# Patient Record
Sex: Male | Born: 1970 | Race: White | Hispanic: No | Smoking: Never smoker
Health system: Southern US, Community
[De-identification: ages and names within clinical notes are randomized; demographics above are authoritative.]

## PROBLEM LIST (undated history)

## (undated) DIAGNOSIS — Z981 Arthrodesis status: Secondary | ICD-10-CM

## (undated) DIAGNOSIS — G8929 Other chronic pain: Secondary | ICD-10-CM

## (undated) DIAGNOSIS — K529 Noninfective gastroenteritis and colitis, unspecified: Secondary | ICD-10-CM

## (undated) DIAGNOSIS — N289 Disorder of kidney and ureter, unspecified: Secondary | ICD-10-CM

## (undated) DIAGNOSIS — N200 Calculus of kidney: Secondary | ICD-10-CM

## (undated) HISTORY — PX: MANDIBLE SURGERY: SHX707

## (undated) HISTORY — PX: SHOULDER ARTHROSCOPY WITH ROTATOR CUFF REPAIR: SHX5685

## (undated) HISTORY — PX: CERVICAL FUSION: SHX112

## (undated) HISTORY — PX: ANKLE ARTHROSCOPY: SUR85

---

## 1999-11-12 ENCOUNTER — Encounter: Payer: Self-pay | Admitting: *Deleted

## 1999-11-12 ENCOUNTER — Emergency Department (HOSPITAL_COMMUNITY): Admission: EM | Admit: 1999-11-12 | Discharge: 1999-11-12 | Payer: Self-pay

## 2000-03-01 ENCOUNTER — Emergency Department (HOSPITAL_COMMUNITY): Admission: EM | Admit: 2000-03-01 | Discharge: 2000-03-01 | Payer: Self-pay | Admitting: Emergency Medicine

## 2003-01-15 ENCOUNTER — Emergency Department (HOSPITAL_COMMUNITY): Admission: EM | Admit: 2003-01-15 | Discharge: 2003-01-16 | Payer: Self-pay | Admitting: Emergency Medicine

## 2003-01-16 ENCOUNTER — Encounter: Payer: Self-pay | Admitting: Emergency Medicine

## 2003-03-13 ENCOUNTER — Emergency Department (HOSPITAL_COMMUNITY): Admission: EM | Admit: 2003-03-13 | Discharge: 2003-03-13 | Payer: Self-pay | Admitting: Emergency Medicine

## 2003-03-15 ENCOUNTER — Emergency Department (HOSPITAL_COMMUNITY): Admission: EM | Admit: 2003-03-15 | Discharge: 2003-03-15 | Payer: Self-pay | Admitting: Emergency Medicine

## 2003-04-08 ENCOUNTER — Emergency Department (HOSPITAL_COMMUNITY): Admission: EM | Admit: 2003-04-08 | Discharge: 2003-04-08 | Payer: Self-pay | Admitting: Emergency Medicine

## 2003-04-24 ENCOUNTER — Emergency Department (HOSPITAL_COMMUNITY): Admission: EM | Admit: 2003-04-24 | Discharge: 2003-04-24 | Payer: Self-pay | Admitting: *Deleted

## 2003-04-24 ENCOUNTER — Encounter: Payer: Self-pay | Admitting: *Deleted

## 2003-05-27 ENCOUNTER — Emergency Department (HOSPITAL_COMMUNITY): Admission: EM | Admit: 2003-05-27 | Discharge: 2003-05-27 | Payer: Self-pay | Admitting: Emergency Medicine

## 2003-05-27 ENCOUNTER — Encounter: Payer: Self-pay | Admitting: Emergency Medicine

## 2003-07-12 ENCOUNTER — Emergency Department (HOSPITAL_COMMUNITY): Admission: EM | Admit: 2003-07-12 | Discharge: 2003-07-12 | Payer: Self-pay

## 2003-09-23 ENCOUNTER — Emergency Department (HOSPITAL_COMMUNITY): Admission: EM | Admit: 2003-09-23 | Discharge: 2003-09-23 | Payer: Self-pay | Admitting: Emergency Medicine

## 2003-09-25 ENCOUNTER — Emergency Department (HOSPITAL_COMMUNITY): Admission: EM | Admit: 2003-09-25 | Discharge: 2003-09-25 | Payer: Self-pay | Admitting: Emergency Medicine

## 2003-09-27 ENCOUNTER — Ambulatory Visit (HOSPITAL_COMMUNITY): Admission: RE | Admit: 2003-09-27 | Discharge: 2003-09-27 | Payer: Self-pay | Admitting: Urology

## 2003-09-30 ENCOUNTER — Inpatient Hospital Stay (HOSPITAL_COMMUNITY): Admission: EM | Admit: 2003-09-30 | Discharge: 2003-10-03 | Payer: Self-pay | Admitting: Emergency Medicine

## 2003-10-14 ENCOUNTER — Emergency Department (HOSPITAL_COMMUNITY): Admission: EM | Admit: 2003-10-14 | Discharge: 2003-10-14 | Payer: Self-pay

## 2003-11-12 ENCOUNTER — Emergency Department (HOSPITAL_COMMUNITY): Admission: EM | Admit: 2003-11-12 | Discharge: 2003-11-12 | Payer: Self-pay | Admitting: Emergency Medicine

## 2003-12-04 ENCOUNTER — Emergency Department (HOSPITAL_COMMUNITY): Admission: EM | Admit: 2003-12-04 | Discharge: 2003-12-04 | Payer: Self-pay | Admitting: Emergency Medicine

## 2004-02-02 ENCOUNTER — Emergency Department (HOSPITAL_COMMUNITY): Admission: EM | Admit: 2004-02-02 | Discharge: 2004-02-02 | Payer: Self-pay | Admitting: Emergency Medicine

## 2004-04-16 ENCOUNTER — Emergency Department (HOSPITAL_COMMUNITY): Admission: EM | Admit: 2004-04-16 | Discharge: 2004-04-16 | Payer: Self-pay | Admitting: Emergency Medicine

## 2004-08-02 ENCOUNTER — Emergency Department (HOSPITAL_COMMUNITY): Admission: AC | Admit: 2004-08-02 | Discharge: 2004-08-02 | Payer: Self-pay

## 2004-08-05 ENCOUNTER — Emergency Department (HOSPITAL_COMMUNITY): Admission: EM | Admit: 2004-08-05 | Discharge: 2004-08-05 | Payer: Self-pay | Admitting: Physician Assistant

## 2004-08-05 ENCOUNTER — Emergency Department (HOSPITAL_COMMUNITY): Admission: EM | Admit: 2004-08-05 | Discharge: 2004-08-05 | Payer: Self-pay | Admitting: Emergency Medicine

## 2004-08-14 ENCOUNTER — Emergency Department (HOSPITAL_COMMUNITY): Admission: EM | Admit: 2004-08-14 | Discharge: 2004-08-14 | Payer: Self-pay | Admitting: Family Medicine

## 2004-08-25 IMAGING — RF DG RETROGRADE PYELOGRAM
1 series · 5 of 5 positions shown · non-contrast
Comparison: none

CLINICAL DATA: Lt ureteral calculi.
 ONE VIEW ABDOMEN
 The proximal left ureteral calculus noted by CT scan is not definitely visualized by plain film.  The bowel gas pattern is normal.  There is a phlebolith within the right pelvis. 
 IMPRESSION
 The proximal left ureteral calculus noted on CT scan is not definitely visualized by plain film.
 LEFT RETROGRADE PYELOGRAM
 Images were obtained intraoperatively with a C-arm.  No definite filling defect or stricture is seen on the images available for evaluation.  A left ureteral stent was placed at the time of the procedure with the proximal portion of the stent located within the left renal pelvis.  The distal portion of the stent is not visualized on these views.
 As above.

[Series 745: run · 5 of 5 slices shown]
[im 1/5]
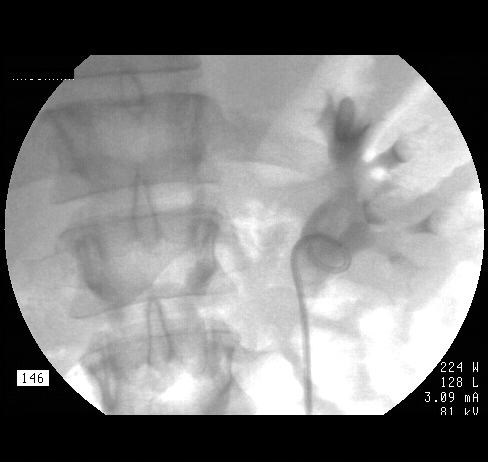
[im 2/5]
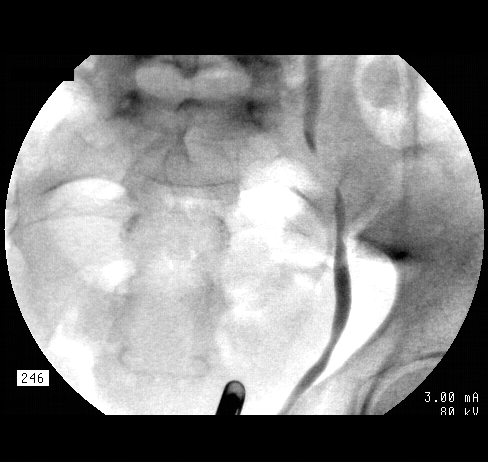
[im 3/5]
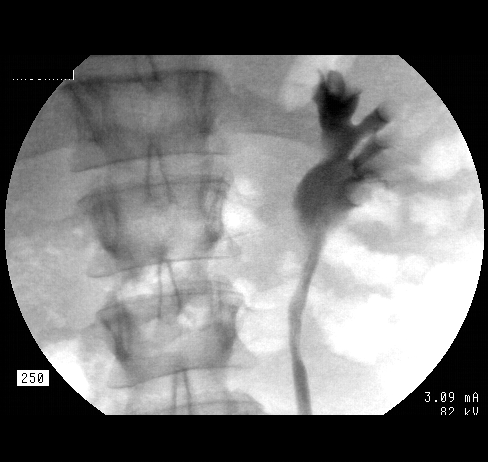
[im 4/5]
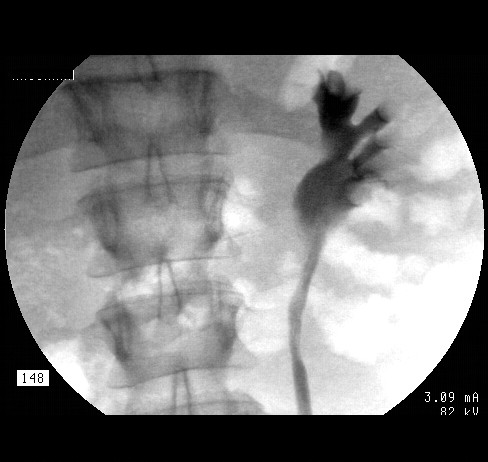
[im 5/5]
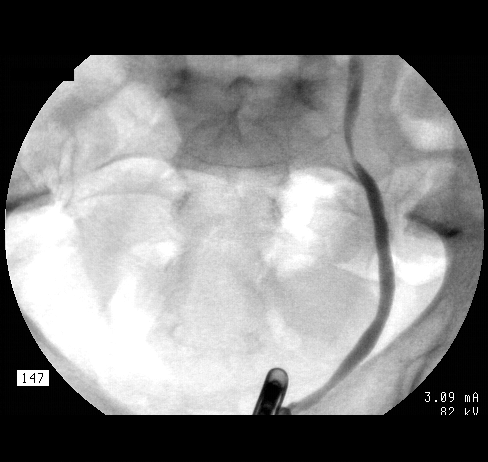

[5 of 5 positions shown; findings below may reference images not displayed]

## 2004-10-06 ENCOUNTER — Emergency Department (HOSPITAL_COMMUNITY): Admission: EM | Admit: 2004-10-06 | Discharge: 2004-10-07 | Payer: Self-pay | Admitting: Emergency Medicine

## 2004-10-17 ENCOUNTER — Emergency Department (HOSPITAL_COMMUNITY): Admission: EM | Admit: 2004-10-17 | Discharge: 2004-10-17 | Payer: Self-pay

## 2004-10-30 ENCOUNTER — Emergency Department (HOSPITAL_COMMUNITY): Admission: EM | Admit: 2004-10-30 | Discharge: 2004-10-31 | Payer: Self-pay | Admitting: *Deleted

## 2004-11-11 ENCOUNTER — Ambulatory Visit (HOSPITAL_COMMUNITY): Admission: RE | Admit: 2004-11-11 | Discharge: 2004-11-11 | Payer: Self-pay | Admitting: Urology

## 2004-11-14 ENCOUNTER — Emergency Department (HOSPITAL_COMMUNITY): Admission: EM | Admit: 2004-11-14 | Discharge: 2004-11-14 | Payer: Self-pay | Admitting: Family Medicine

## 2004-12-02 ENCOUNTER — Emergency Department: Payer: Self-pay | Admitting: General Practice

## 2004-12-09 ENCOUNTER — Emergency Department (HOSPITAL_COMMUNITY): Admission: EM | Admit: 2004-12-09 | Discharge: 2004-12-09 | Payer: Self-pay | Admitting: Family Medicine

## 2005-01-15 ENCOUNTER — Inpatient Hospital Stay (HOSPITAL_COMMUNITY): Admission: EM | Admit: 2005-01-15 | Discharge: 2005-01-19 | Payer: Self-pay | Admitting: Emergency Medicine

## 2005-04-18 ENCOUNTER — Encounter: Admission: RE | Admit: 2005-04-18 | Discharge: 2005-04-18 | Payer: Self-pay | Admitting: Occupational Medicine

## 2005-09-25 IMAGING — CT CT ABDOMEN W/O CM
1 series · 15 of 32 positions shown, 19 images · non-contrast
Comparison: 10/17/04.

CLINICAL DATA: Abdominal and pelvic pain, right-sided flank pain.
TECHNIQUE: Multidetector helical CT scanning obtained through the abdomen and pelvis.

[Series 8567: — · axial · 0.65mm/px · z∈[+1298,+1658]mm · 15 of 81 slices shown, 19 images]
[im 6/81  soft-tissue]
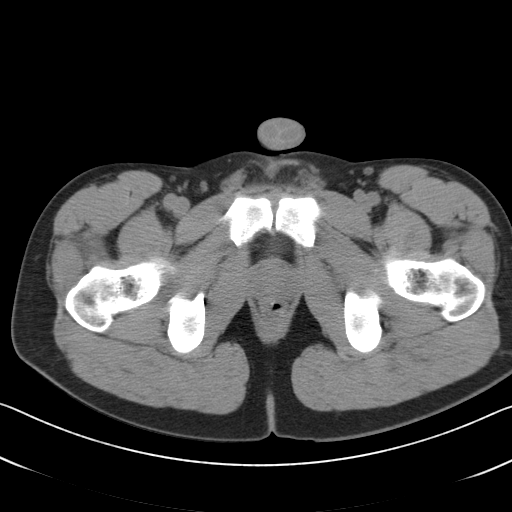
[im 6/81  bone]
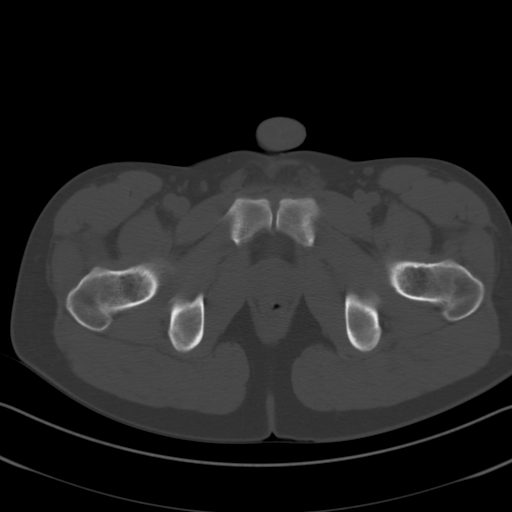
[im 11/81  soft-tissue]
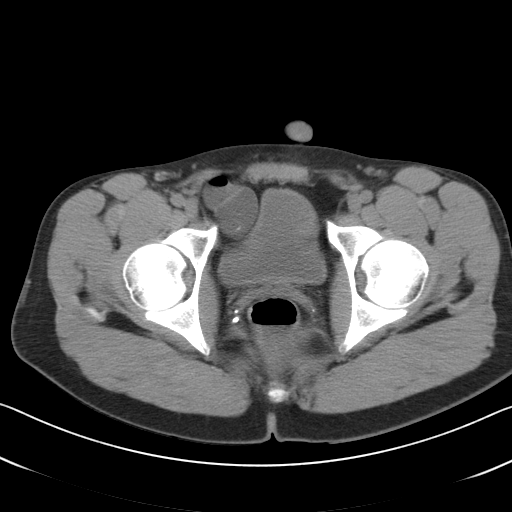
[im 16/81  soft-tissue]
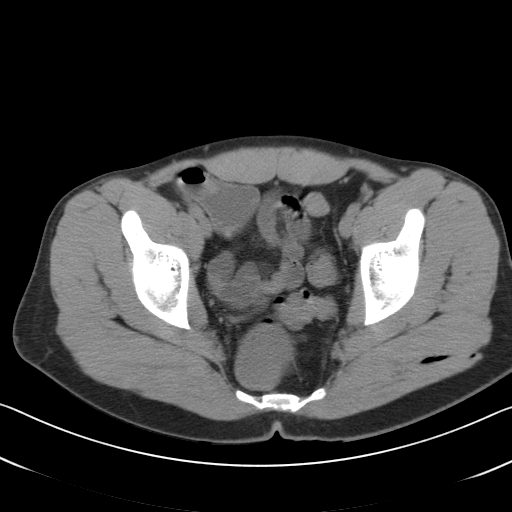
[im 24/81  soft-tissue]
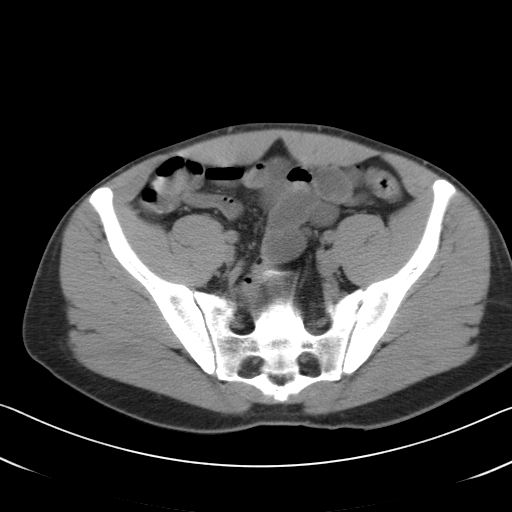
[im 29/81  soft-tissue]
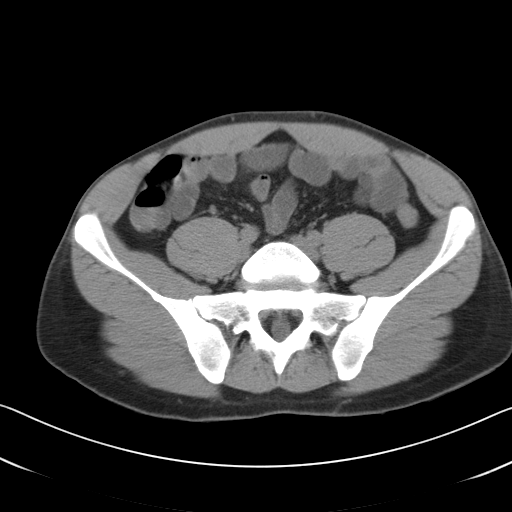
[im 34/81  soft-tissue]
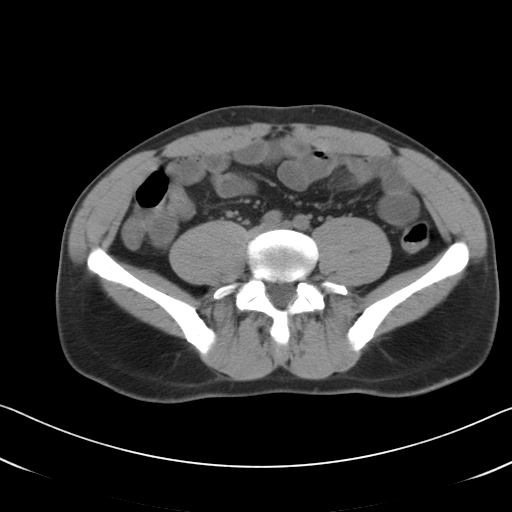
[im 42/81  soft-tissue]
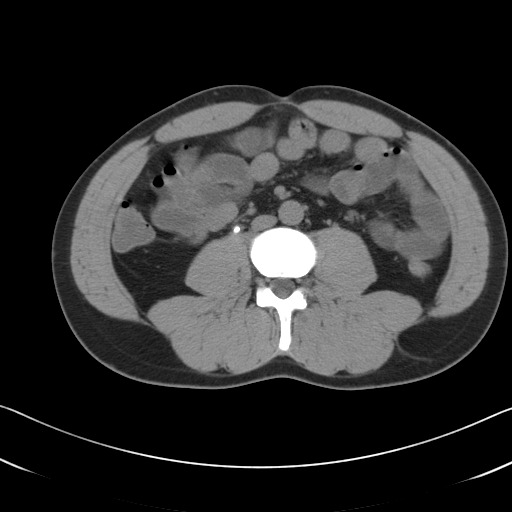
[im 47/81  soft-tissue]
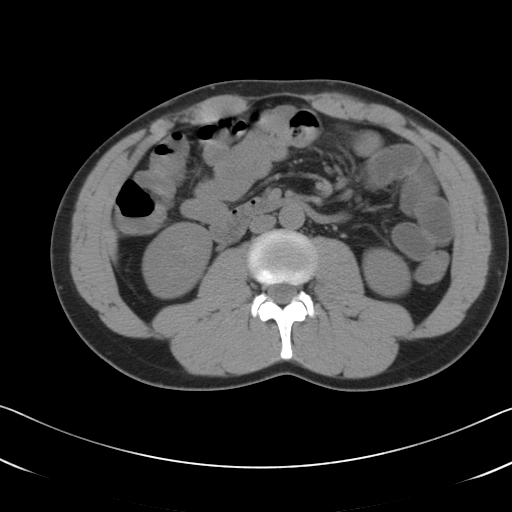
[im 52/81  soft-tissue]
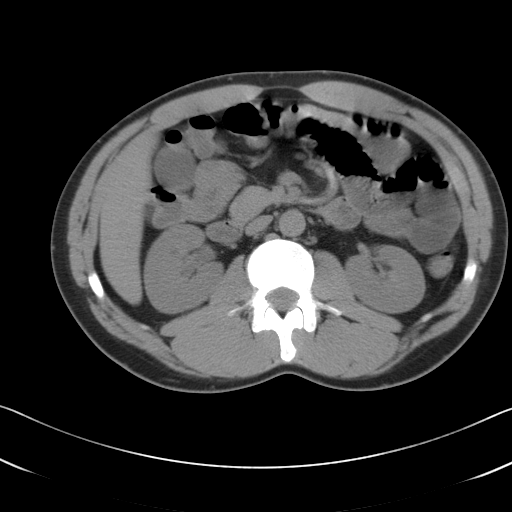
[im 52/81  bone]
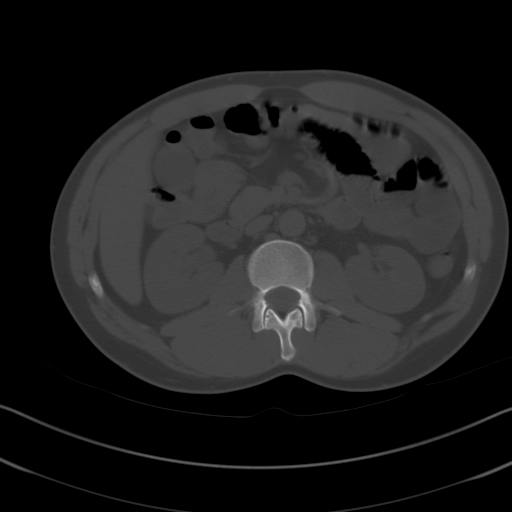
[im 57/81  soft-tissue]
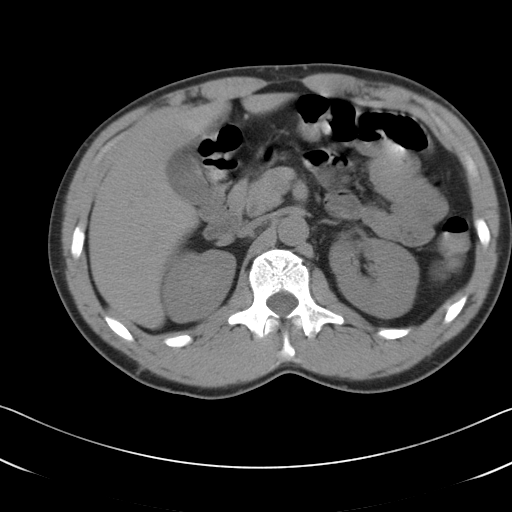
[im 65/81  soft-tissue]
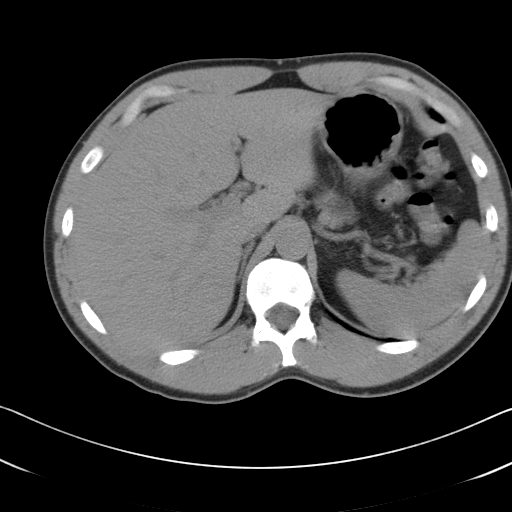
[im 70/81  soft-tissue]
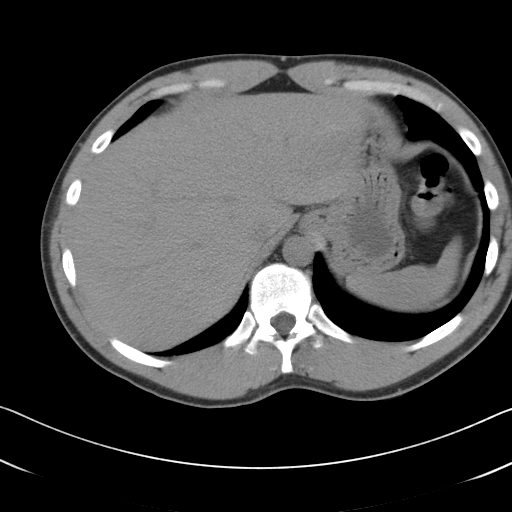
[im 70/81  lung]
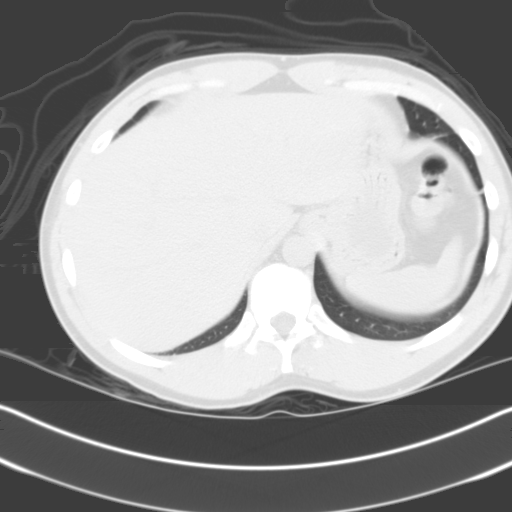
[im 73/81  lung]
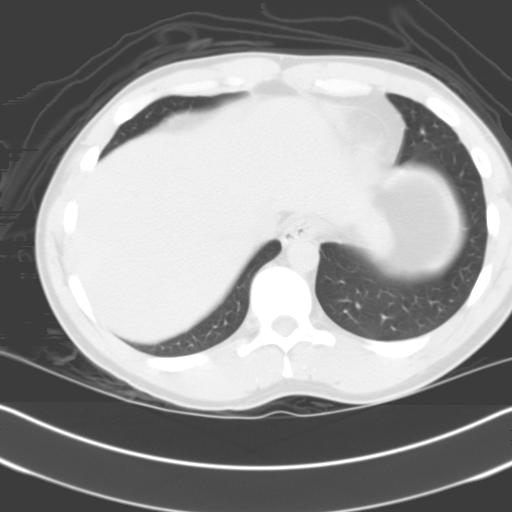
[im 75/81  soft-tissue]
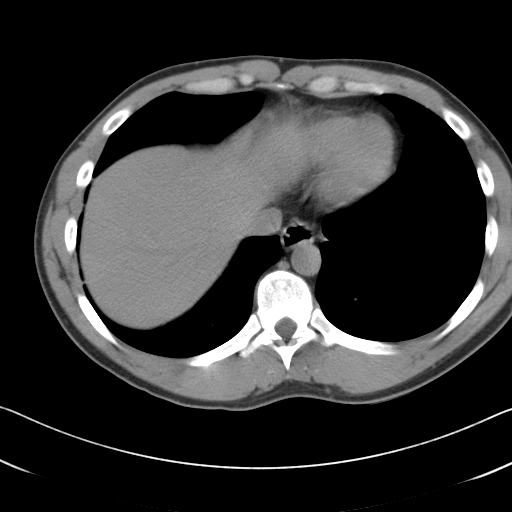
[im 75/81  lung]
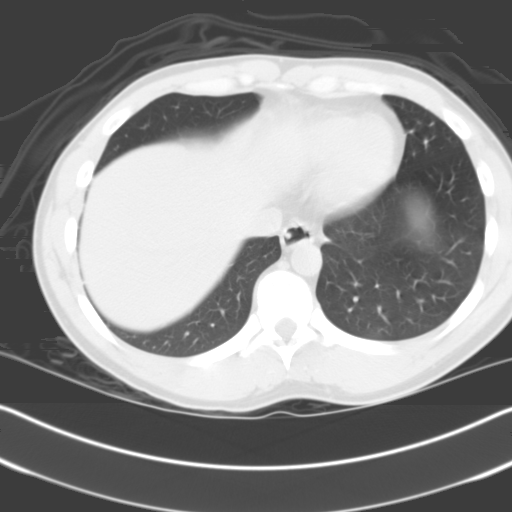
[im 78/81  lung]
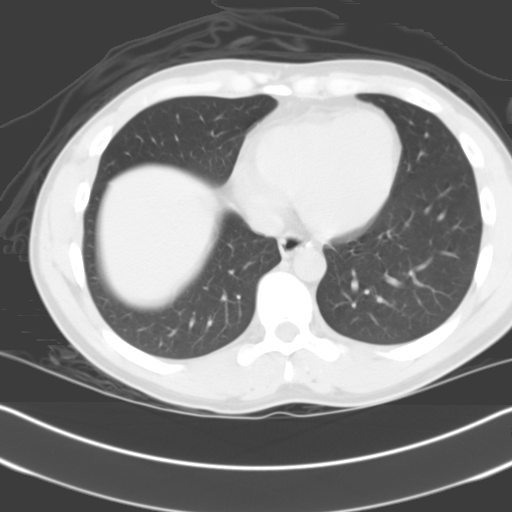

[15 of 32 positions shown; findings below may reference images not displayed]

CT ABDOMEN WITHOUT CONTRAST:
 Mild right hydronephrosis has decreased from the last study.  5 mm calculus in the proximal right ureter is stable.  10 x 6 mm non-obstructing right upper pole renal calculus is unchanged as well as a few tiny punctate bilateral renal calculi.  The liver, gallbladder, spleen, adrenal glands, pancreas are unremarkable.  Please note that parenchymal lesions may be missed as intravenous contrast was not administered.  Fluid-filled loops of small bowel and colon noted without wall thickening or obstruction.  No evidence of abdominal aortic aneurysm, free fluid, or enlarged lymph nodes.
IMPRESSION: 1.  Stable 5 mm proximal right ureteral calculus with decreasing right hydronephrosis.
 2.  Stable non-obstructing renal calculi.
 3.  Fluid-filled loops of small bowel and colon.  This can be seen with gastroenteritis but correlate clinically.
 CT PELVIS WITHOUT CONTRAST:
 Fluid-filled loops of nondistended small bowel and colon noted.  The bladder is unremarkable.  No evidence of free fluid or enlarged lymph nodes.  Gas in the left gluteus maximus muscle is new.  If this patient has not had a recent injection, infection is not excluded.  Bilateral pars defects L-5.  Grade I anterolisthesis of L-5 on S-1 is unchanged.
IMPRESSION: 1.  Gas in the left gluteus maximus muscle.  If the patient has not had injection or a procedure in this area then infection is not excluded.  Recommend followup as indicated. 
 2.  Fluid-filled nondistended small bowel and colon. This can be seen with gastroenteritis. 
 3.  Stable L-5 pars defects and grade I anterolisthesis of L-5 on S-1.

## 2005-10-15 ENCOUNTER — Encounter: Admission: RE | Admit: 2005-10-15 | Discharge: 2005-10-15 | Payer: Self-pay | Admitting: Sports Medicine

## 2005-10-24 ENCOUNTER — Encounter: Admission: RE | Admit: 2005-10-24 | Discharge: 2005-10-24 | Payer: Self-pay | Admitting: Sports Medicine

## 2005-10-28 ENCOUNTER — Ambulatory Visit (HOSPITAL_BASED_OUTPATIENT_CLINIC_OR_DEPARTMENT_OTHER): Admission: RE | Admit: 2005-10-28 | Discharge: 2005-10-28 | Payer: Self-pay | Admitting: Orthopedic Surgery

## 2005-11-23 ENCOUNTER — Emergency Department (HOSPITAL_COMMUNITY): Admission: EM | Admit: 2005-11-23 | Discharge: 2005-11-23 | Payer: Self-pay | Admitting: Family Medicine

## 2006-01-06 ENCOUNTER — Emergency Department (HOSPITAL_COMMUNITY): Admission: EM | Admit: 2006-01-06 | Discharge: 2006-01-06 | Payer: Self-pay | Admitting: Emergency Medicine

## 2006-04-10 ENCOUNTER — Emergency Department: Payer: Self-pay | Admitting: Emergency Medicine

## 2007-04-20 ENCOUNTER — Emergency Department (HOSPITAL_COMMUNITY): Admission: EM | Admit: 2007-04-20 | Discharge: 2007-04-20 | Payer: Self-pay | Admitting: *Deleted

## 2007-06-24 ENCOUNTER — Emergency Department (HOSPITAL_COMMUNITY): Admission: EM | Admit: 2007-06-24 | Discharge: 2007-06-24 | Payer: Self-pay | Admitting: Emergency Medicine

## 2007-09-10 ENCOUNTER — Emergency Department (HOSPITAL_COMMUNITY): Admission: EM | Admit: 2007-09-10 | Discharge: 2007-09-10 | Payer: Self-pay | Admitting: *Deleted

## 2007-10-26 ENCOUNTER — Emergency Department (HOSPITAL_COMMUNITY): Admission: EM | Admit: 2007-10-26 | Discharge: 2007-10-26 | Payer: Self-pay | Admitting: Family Medicine

## 2008-08-13 ENCOUNTER — Emergency Department (HOSPITAL_COMMUNITY): Admission: EM | Admit: 2008-08-13 | Discharge: 2008-08-13 | Payer: Self-pay | Admitting: Family Medicine

## 2008-10-10 ENCOUNTER — Ambulatory Visit (HOSPITAL_COMMUNITY): Admission: AD | Admit: 2008-10-10 | Discharge: 2008-10-10 | Payer: Self-pay | Admitting: Family Medicine

## 2008-11-23 ENCOUNTER — Ambulatory Visit (HOSPITAL_COMMUNITY): Admission: RE | Admit: 2008-11-23 | Discharge: 2008-11-24 | Payer: Self-pay | Admitting: Neurosurgery

## 2009-10-17 ENCOUNTER — Emergency Department (HOSPITAL_COMMUNITY): Admission: EM | Admit: 2009-10-17 | Discharge: 2009-10-17 | Payer: Self-pay | Admitting: Family Medicine

## 2009-10-21 ENCOUNTER — Emergency Department (HOSPITAL_COMMUNITY): Admission: EM | Admit: 2009-10-21 | Discharge: 2009-10-21 | Payer: Self-pay | Admitting: Emergency Medicine

## 2010-03-17 ENCOUNTER — Emergency Department (HOSPITAL_COMMUNITY): Admission: EM | Admit: 2010-03-17 | Discharge: 2010-03-17 | Payer: Self-pay | Admitting: Family Medicine

## 2010-07-04 ENCOUNTER — Ambulatory Visit (HOSPITAL_COMMUNITY): Admission: RE | Admit: 2010-07-04 | Discharge: 2010-07-04 | Payer: Self-pay | Admitting: Family Medicine

## 2010-07-19 ENCOUNTER — Emergency Department (HOSPITAL_COMMUNITY): Admission: EM | Admit: 2010-07-19 | Discharge: 2010-07-19 | Payer: Self-pay | Admitting: Emergency Medicine

## 2010-10-20 ENCOUNTER — Encounter: Payer: Self-pay | Admitting: Orthopedic Surgery

## 2010-10-20 ENCOUNTER — Encounter: Payer: Self-pay | Admitting: *Deleted

## 2010-12-11 LAB — URINE MICROSCOPIC-ADD ON

## 2010-12-11 LAB — DIFFERENTIAL
Basophils Absolute: 0 10*3/uL (ref 0.0–0.1)
Lymphocytes Relative: 38 % (ref 12–46)
Neutro Abs: 1.4 10*3/uL — ABNORMAL LOW (ref 1.7–7.7)

## 2010-12-11 LAB — CBC
HCT: 34.1 % — ABNORMAL LOW (ref 39.0–52.0)
MCH: 26.4 pg (ref 26.0–34.0)
MCHC: 32 g/dL (ref 30.0–36.0)
MCV: 82.6 fL (ref 78.0–100.0)
RBC: 4.13 MIL/uL — ABNORMAL LOW (ref 4.22–5.81)
RDW: 16.7 % — ABNORMAL HIGH (ref 11.5–15.5)

## 2010-12-11 LAB — URINALYSIS, ROUTINE W REFLEX MICROSCOPIC
Bilirubin Urine: NEGATIVE
Ketones, ur: NEGATIVE mg/dL
Leukocytes, UA: NEGATIVE
Nitrite: NEGATIVE
Protein, ur: NEGATIVE mg/dL
Urobilinogen, UA: 0.2 mg/dL (ref 0.0–1.0)

## 2010-12-11 LAB — BASIC METABOLIC PANEL
BUN: 10 mg/dL (ref 6–23)
Creatinine, Ser: 0.97 mg/dL (ref 0.4–1.5)
GFR calc Af Amer: >60 mL/min (ref >60)
GFR calc non Af Amer: >60 mL/min (ref >60)

## 2010-12-11 LAB — HEMOCCULT GUIAC POC 1CARD (OFFICE): Fecal Occult Bld: NEGATIVE

## 2010-12-16 LAB — POCT URINALYSIS DIP (DEVICE)
Bilirubin Urine: NEGATIVE
Glucose, UA: NEGATIVE mg/dL
Nitrite: NEGATIVE

## 2010-12-16 LAB — POCT I-STAT, CHEM 8
Chloride: 110 mEq/L (ref 96–112)
HCT: 38 % — ABNORMAL LOW (ref 39.0–52.0)
Potassium: 4.1 mEq/L (ref 3.5–5.1)
Sodium: 139 mEq/L (ref 135–145)

## 2011-01-14 LAB — BASIC METABOLIC PANEL
Chloride: 105 mEq/L (ref 96–112)
GFR calc Af Amer: >60 mL/min (ref >60)
Potassium: 4.5 mEq/L (ref 3.5–5.1)
Sodium: 139 mEq/L (ref 135–145)

## 2011-01-14 LAB — URINALYSIS, ROUTINE W REFLEX MICROSCOPIC
Glucose, UA: NEGATIVE mg/dL
Nitrite: NEGATIVE
Protein, ur: NEGATIVE mg/dL
pH: 5 (ref 5.0–8.0)

## 2011-01-14 LAB — CBC
HCT: 37.1 % — ABNORMAL LOW (ref 39.0–52.0)
MCV: 87.8 fL (ref 78.0–100.0)
RBC: 4.22 MIL/uL (ref 4.22–5.81)
WBC: 4.5 10*3/uL (ref 4.0–10.5)

## 2011-01-14 LAB — APTT: aPTT: 29 seconds (ref 24–37)

## 2011-02-06 ENCOUNTER — Emergency Department (HOSPITAL_COMMUNITY)
Admission: EM | Admit: 2011-02-06 | Discharge: 2011-02-07 | Disposition: A | Discharge status: Home / Self Care | Payer: Self-pay | Attending: Emergency Medicine | Admitting: Emergency Medicine

## 2011-02-06 DIAGNOSIS — F329 Major depressive disorder, single episode, unspecified: Secondary | ICD-10-CM | POA: Insufficient documentation

## 2011-02-06 DIAGNOSIS — F3289 Other specified depressive episodes: Secondary | ICD-10-CM | POA: Insufficient documentation

## 2011-02-06 DIAGNOSIS — R319 Hematuria, unspecified: Secondary | ICD-10-CM | POA: Insufficient documentation

## 2011-02-06 DIAGNOSIS — R109 Unspecified abdominal pain: Secondary | ICD-10-CM | POA: Insufficient documentation

## 2011-02-07 LAB — URINALYSIS, ROUTINE W REFLEX MICROSCOPIC
Ketones, ur: NEGATIVE mg/dL
Leukocytes, UA: NEGATIVE
Nitrite: NEGATIVE
Specific Gravity, Urine: 1.02 (ref 1.005–1.030)
Urobilinogen, UA: 0.2 mg/dL (ref 0.0–1.0)
pH: 6 (ref 5.0–8.0)

## 2011-02-07 LAB — URINE MICROSCOPIC-ADD ON

## 2011-02-11 NOTE — Op Note (Signed)
NAME:  Dale Contreras, Dale Contreras NO.:  [id number]   MEDICAL RECORD NO.:  [id number]          PATIENT TYPE:  OIB   LOCATION:  3534                         FACILITY:  [facility]   PHYSICIAN:  Clydene Fake, M.D.  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   POSTOPERATIVE DIAGNOSIS:  Herniated nucleus pulposus spondylosis C5-6  and C6-7 and with left-sided radiculopathy.   PROCEDURES:  Anterior cervical decompression, diskectomy, and fusion of  C5-6 and C6-7 with LifeNet allograft bone and Trestle anterior cervical  plate.   SURGEON:  Clydene Fake, MD   ASSISTANT:  Coletta Memos, M.D.   ANESTHESIA:  General endotracheal tube anesthesia.   ESTIMATED BLOOD LOSS:  200 mL.   BLOOD GIVEN:  None.   DRAINS:  None.   COMPLICATIONS:  None.   REASON FOR PROCEDURE:  The patient is a 40 year old gentleman with neck  and left arm pain, numbness and weakness, and left C7 radiculopathy with  weakness in left triceps and finger extension, decreased sensation in  left C7 distribution.  An MRI was done showing degenerative change,  spondylytic change at C5-6 with bifemoral narrowing and some  degenerative change at C6-7 also with a large left-sided disk  herniation.  The patient brought in for decompression and fusion at  these levels.   PROCEDURE IN DETAIL:  The patient was brought in the operating room and  general anesthesia was induced.  The patient was then prepped and draped  in a sterile fashion.  The site of incision was injected 10 mL of 1%  lidocaine with epinephrine.  Incision then made from the midline to the  medial border of the sternocleidomastoid muscle on the left side.  Neck  incision was taken down to the platysma and hemostasis was obtained with  Bovie  cauterization.  The platysma was incised with Bovie and blunt  dissection taken to the anterior cervical fascia to the anterior  cervical spine.  Two needles were placed.  X-rays were obtained showing  the needles were at the C4-5 and C5-6 interspaces.  The needle was  removed as the C5-6 disk space was incised with a 15 blade and partial  discectomy was performed.  Longus coli muscles were reflected laterally  on each side and self-retaining retractors were placed into the C5-6 and  C6-7 interspaces.  Disk spaces were again incised with a 15 blade and  diskectomy started with pituitary rongeurs and curettes.  Anterior  osteophytes were removed with Kerrison punches.  Distraction pins were  then placed in the C5 and C7 interspaces and distracted.  Microscope was  brought in for microdissection.  We placed retractors between the C5-6  and C6-7 disk spaces.  Microscope was brought in for microdissection and  starting at C6-7, diskectomy continued with the curettes, pituitary  rongeurs, and 1-and 2-mm Kerrison punches to remove posterior ligament  disk osteophytes and performed bilateral foraminotomies.  There were  free fragments of disk in the left side that was causing significant  compression of the nerve root.  When we finished, we had good  decompression of the nerve root.  On the right side, foraminotomy was  done.  We did get some fairly impressive venous bleeding there.  This  was an easily stopped with Gelfoam and small Gelfoam was left in the  lateral gutter there to keep the hemostasis.  High-speed drill was used  to remove cartilaginous endplate and measured the height of disk space  to be 7 mm and 7-mm LifeNet allograft bone was tapped into place at the  C5-6 disk space, the diskectomy was continued there with curettes,  pituitary rongeurs, 1-and 2-mm Kerrison punches removing posterior disk  osteophytes and ligament decompressing the central canal and performing  bilateral  foraminotomies.  High-speed drill was used to remove callous  endplate and measured the disk space to be 5 mm.  A 5-mm LifeNet  allograft bone was tapped into place there.  We irrigated with  antibiotic solution and the distraction pins were removed.  Hemostasis  obtained with Gelfoam and thrombin.  The weight was removed from the  traction at this point and the bone plugs were firmly in place, and the  Trestle anterior cervical plate was placed over the anterior cervical  spine.  Two screws were placed in the C5, C6, and C7.  These were  tighten down.  Lateral x-rays were obtained showing good position of  plate, screws, and bone plugs at C5-6.  We could not see the C6-7 level  up to the shoulders.  Intraoperatively it looked good.  He had good  hemostasis.  Irrigated with antibiotic solution.  Retractors were  removed and the platysma was closed with 3-0 Vicryl interrupted sutures.  Subcutaneous tissue was closed with 3-0 Vicryl interrupted sutures.  Skin closed with benzoin and Steri-Strips.  Dressing was placed.  The  patient was placed in a soft cervical collar, awoken from anesthesia,  and transferred to recovery room in stable condition.           ______________________________  Clydene Fake, M.D.     JRH/MEDQ  D:  11/23/2008  T:  11/24/2008  Job:  [id number]

## 2011-02-14 NOTE — H&P (Signed)
NAME:  Dale Contreras, Dale Contreras                          ACCOUNT NO.:  [id number]   MEDICAL RECORD NO.:  [id number]                   PATIENT TYPE:  INP   LOCATION:  A337                                 FACILITY:  [facility]   PHYSICIAN:  Dennie Maizes, M.D.                DATE OF BIRTH:  Oct 16, 1970   DATE OF ADMISSION:  09/30/2003  DATE OF DISCHARGE:                                HISTORY & PHYSICAL   CHIEF COMPLAINT:  Severe left flank pain radiating to the front, nausea and  vomiting.   HISTORY OF PRESENT ILLNESS:  This 40 year old male has a past history of  recurrent nephrolithiasis.  He has passed several stones during the past  several years. He experienced sudden onset of severe left flank pain  radiating to the front about two days ago. Denied having any __________ or  hematuria. He had been having moderate nausea and vomiting. He had been to  the emergency room x2. Further evaluation has been done with a noncontrast  CT scan of the abdomen and pelvis. This revealed bilateral small renal  calculi. There was moderate left hydronephrosis. There was an obstructing 8  x 6 mm-sized left upper ureteral calculus at the level of the uteropelvic  junction, the sound of the distal ureteral calculus on the left side  measuring 6 x 4 mm in size. The patient did not have adequate pain relief in  the ER. He had been admitted to the hospital for pain control and definitive  treatment.   PAST MEDICAL HISTORY:  History of recurrent nephrolithiasis. No medical  illnesses.   MEDICATIONS:  None.   ALLERGIES:  None.   PHYSICAL EXAMINATION:  HEENT:  Normal.  NECK:  No masses.  LUNGS:  Clear to auscultation.  HEART:  Regular rate and rhythm. No murmurs.  ABDOMEN:  Soft, no palpable flank mass. Moderate left costovertebral angle  tenderness is noted. Bladder not palpable. Penis and testes are normal.   ADMISSION LABORATORY DATA:  CBC:  WBC 10.7, hemoglobin 13.4, hematocrit  40.1. Electrolytes:  Within  normal range. BUN 18, creatinine 1.6. Urinalysis  revealed blood large, leukocyte esterase negative, RBCs 21 to 52 per high  powered field, WBCs 0 to 2 per high powered field, bacteria few.   IMPRESSION:  Left upper ureter calculus with obstruction, left  hydronephrosis, left renal colic, bilateral renal calculi.   PLAN:  1. Admit the patient to the hospital.  2. IV fluids.  3. Parenteral narcotics.  4. Strain all urine for stones.  5. Discuss with the patient about management options. As he has severe     persistent pain, he has agreed for cystoscopy, left retrograde pyelogram,     left ureteroscopy with basket extraction, and left ureteral stent     placement under anesthesia October 01, 2003. I  have discussed with the     patient regarding diagnosis, operative details, alternative treatments,  outcome, and possible risks and complications, and he has agreed for the     procedure to be done.     ___________________________________________                                         Dennie Maizes, M.D.   SK/MEDQ  D:  09/30/2003  T:  09/30/2003  Job:  [id number]

## 2011-02-14 NOTE — H&P (Signed)
NAME:  Dale Contreras, CU NO.:  [id number]   MEDICAL RECORD NO.:  [id number]          PATIENT TYPE:  EMS   LOCATION:  ED                           FACILITY:  [facility]   PHYSICIAN:  Jackie Plum, M.D.DATE OF BIRTH:  07-21-1971   DATE OF ADMISSION:  01/15/2005  DATE OF DISCHARGE:                                HISTORY & PHYSICAL   CHIEF COMPLAINT:  Generalized weakness and bruising of the skin.   HISTORY OF PRESENT ILLNESS:  Mr. Dale Contreras is a 40 year old Caucasian gentleman  who presented with above complaints.  He was accompanied to the ER by his  mother and aunt.  According to the patient, he was well and about 2 days ago  experienced some generalized weakness after a brief spider bite.  He said he  was bit by a spider he believes on the left upper trunk area.  Since then,  he is having some generalized weakness, episodes of nausea.  He had vomited  only one time.  He has had some episodes of fever without any chills as  well.  He has been feeling weak with myalgia.  Yesterday, the patient was  found by the family around 8 o'clock in the morning.  He was noted to be  disoriented and they gave him some p.o. fluids, after which they said he  became more coherent around 1 o'clock in the morning.  They did not seek  medical assistance because around 1:30 p.m., he became more coherent.  He  presents with complaints of some pain in his joints as well as myalgia  mainly involving his right hip and right elbow area.  He denies any  constipation, diarrhea, frequency of micturition, or dysuria.  However, he  alleges that he has been urinating a lot and has had the need to drink a lot  of fluids.  He denies any chest pain, shortness of breath, PND, orthopnea,  palpitations, cough, sputum production, hemoptysis, hematuria, hematochezia,  melena, or abdominal pain.  His current main complaints are generalized  weakness, myalgia, and fever, as well as headaches with elbow and hip  pain  on the right side.   PAST MEDICAL HISTORY:  The patient does not have any previous history of  diabetes, hypertension.  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  He has had lithotripsy for renal stones previously.   MEDICATIONS:  He is not on any regular medicines at the moment.   ALLERGIES:  He has indicated that he is allergic to [facility].   FAMILY HISTORY:  Negative for any history of heart disease or stroke.   SOCIAL HISTORY:  The patient was recently divorced by his wife about 2 weeks  ago.  He lives in his own home.  He denies alcohol or illicit drug use.  However, the aunt who accompanied the patient later told me in confidence  that he has been drinking a lot.   PHYSICAL EXAMINATION:  VITAL SIGNS:  Blood pressure 137/81, temperature  101.0.  This has come down to 100.0 after Tylenol in the ER.  Pulse rate was  initially 114 per minute with a peak of 117 per minute but had come down to  98 per minute at the time of my evaluation, respirations 20, O2 saturation  98% on room air.  GENERAL:  The patient looked weak.  However, he was not pale looking.  He  was not in any acute cardiopulmonary distress.  HEENT:  He had bruising of his right lower parietal area of his skull.  Pupils were equal, round, and reactive to light.  Extraocular movements  intact.  Oropharynx moist.  No erythema.  No conjunctival lesions were found  on his tongue as previously complained to nurse by patient.  NECK:  Supple, no JVD.  He did not have any cervical lymphadenopathy.  No  thyromegaly.  LUNGS:  Clear to auscultation bilaterally.  CARDIAC:  Regular rate and rhythm.  No gallops or murmur.  ABDOMEN:  He had a right upper surgical scar which was well healed.  Bowel  sounds were present, normoactive.  No tenderness.  No rebound tenderness.  There was no guarding.  EXTREMITIES:  No cyanosis, no edema.  SKIN:  He had some bruising about 4 cm x 3 cm in the area of his left upper  back  region.  It was not blanchable.  NEUROLOGIC:  The patient was alert, oriented x3, no acute focal deficits.   LABORATORY DATA:  CT scan of the head was done by the ED physician which was  negative for any acute intracranial process.  WBC 7.4, hemoglobin 13.5,  hematocrit 38.8, MCV 87.0, platelet count 340.  PT 1.5, INR 38 seconds.  Sodium 138, potassium 3.3, chloride 104, CO2 28, glucose 113, BUN 9,  creatinine 1.1, calcium 8.0, total protein 5.8, albumin 3.1, alkaline  phosphatase 154.  Total bilirubin normal at 1.1.  Urinalysis indicated color  was amber, appearance clear, specific gravity 1.018, pH 6.5, glucose 100,  HEMOGLOBIN WAS LARGE IN THE URINE, bilirubin negative, ketones negative,  total protein 100 mg/dl, urobilinogen 4.0, nitrite negative, leukocyte  esterase negative, urine WBC 0-2 per high power field, RBC of urine 0-2 per  high power field as well.   IMPRESSION:  1.  Transaminitis, generalized weakness, myalgia, bruising, with large      hemoglobin on urinalysis and surprisingly normal urine red blood cells      in a young gentleman who recently was divorced from his wife.  The most      likely cause of this patient's symptomatology is rhabdomyolysis in view      of urinalysis findings, elevated isolated transaminitis, history from      the aunt who mentioned that the patient has been drinking a lot which      could cause rhabdomyolysis in this patient.  2.  Hypokalemia, which may be due to diagnosis #1.   PLAN:  Get ultrasound of the abdomen to look at the hepatobiliary system as  well as total CPK. We will order other laboratory workup including urine  drug screen, ESR.  We will also obtain x-ray of the right hip and elbow  because of complaints of pain in these areas.  On account of fever of  inexplicable etiology, will get urine culture and blood culture as well as chest x-ray.  We will also obtain a 12-lead EKG.  We will get ANA in  addition to ESR.  Plan is to  start the patient also on IV fluid.  Will start  him with D5 normal saline with 20 mEq of KCl in  the interim and continue him  on thiamine and folic acid based on history of alcohol abuse.  We will  change the IV fluid to saline should the CPK be really elevated as  suspected.  We will continue supportive care with antiemetics.  He may  benefit from psychiatric consult and evaluation while in the hospital.  Further recommendations will be made as the database expands.      GO/MEDQ  D:  01/15/2005  T:  01/15/2005  Job:  [id number]

## 2011-02-14 NOTE — Op Note (Signed)
NAME:  Dale Contreras, Dale Contreras                ACCOUNT NO.:  [id number]   MEDICAL RECORD NO.:  [id number]          PATIENT TYPE:  AMB   LOCATION:  DSC                          FACILITY:  [facility]   PHYSICIAN:  Robert A. Thurston Hole, M.D. DATE OF BIRTH:  [birth year]   DATE OF PROCEDURE:  10/28/2005  DATE OF DISCHARGE:                                 OPERATIVE REPORT   PREOPERATIVE DIAGNOSES:  1.  Left shoulder SLAP tear.  2.  Left shoulder loose bodies.   POSTOPERATIVE DIAGNOSES:  1.  Left shoulder SLAP tear.  2.  Left shoulder loose bodies.   PROCEDURE:  1.  Left shoulder EUA followed by arthroscopically assisted SLAP repair      using Arthrex suture anchors x2.  2.  Left shoulder removal and debridement of loose bodies.   SURGEON:  Elana Alm. Thurston Hole, M.D.   ASSISTANT:  Julien Girt, P.A.   ANESTHESIA:  General.   OPERATIVE TIME:  1 hour.   COMPLICATIONS:  None.   INDICATIONS FOR PROCEDURE:  Dale Contreras is a 40 year old gentleman who has  had over a year of left shoulder pain increasing in nature with exam and MRI  documenting an SLAP tear and possible loose bodies. He has failed  conservative care and is now to undergo arthroscopy and repair.   DESCRIPTION:  Dale Contreras was brought to operating room on October 28, 2005  after interscalene block was placed in the holding room by anesthesia. He  was placed on the operative table in supine position. His left shoulder was  examined under anesthesia. He had full range of motion and shoulder was  stable to ligamentous exam. He then placed in beach chair position and his  shoulder and arm was prepped using sterile DuraPrep and draped using sterile  technique. He received Ancef 1 g IV preoperatively for prophylaxis.  Initially through a posterior arthroscopic portal, the arthroscope with a  pump attached was placed and through an anterior portal, an arthroscopic  probe was placed. On initial inspection the articular cartilage in the  glenohumeral joint was found to be intact. The anterior labrum superiorly  and posterior superiorly was torn and separated from the superior glenoid  rim consistent with an SLAP tear. There was also loose pieces of labral  tissue in the joint and these were removed. The anterior inferior labrum and  anterior inferior glenohumeral ligament complex was intact. Posterior  inferior labrum was intact. The rotator cuff was thoroughly inspected and  this was found to be intact. A accessory superior portal was then made and  using another accessory entrance area, a 3.0 Arthrex suture anchor was  placed in the posterior superior aspect of the glenoid rim. After debriding  this area. This anchor had two sutures in it and then each of these sutures  were passed through the posterior superior aspect of the labral tear through  the superior portal and tied down sequentially thus two separate sutures  were used to repair the posterior superior aspect of the labral tear. After  this was done, then an anterior superior anchor was placed through the  anterior portal in the anterior superior glenoid rim and then this suture  was passed through the anterior superior labral tear and tied down thus  securing the anterior portion of the tear. This gave three separate tight  sutures to secure both the posterior and anterior aspects of the SLAP tear.  A firm and tight repair was achieved. After this was done no further  pathology was noted. The instruments were removed. Portals closed with 3-0  nylon suture. Sterile dressings and a sling applied. The patient awakened  and taken to recovery in stable condition.   FOLLOW-UP CARE:  Dale Contreras will be followed as an outpatient on Mepergan  Fortis and Talwin NX for pain. He will be seen back in the office in a week  for sutures out and follow-up.      Robert A. Thurston Hole, M.D.  Electronically Signed     RAW/MEDQ  D:  10/28/2005  T:  10/29/2005  Job:  [id number]

## 2011-02-14 NOTE — Op Note (Signed)
NAME:  Dale Contreras, Dale Contreras                          ACCOUNT NO.:  [id number]   MEDICAL RECORD NO.:  [id number]                   PATIENT TYPE:  INP   LOCATION:  A337                                 FACILITY:  [facility]   PHYSICIAN:  Dennie Maizes, M.D.                DATE OF BIRTH:  [birth year]   DATE OF PROCEDURE:  10/01/2003  DATE OF DISCHARGE:                                 OPERATIVE REPORT   PREOPERATIVE DIAGNOSES:  1. Left ureteral calculus obstruction.  2. Left hydronephrosis.  3. Left renal colic.   POSTOPERATIVE DIAGNOSES:  1. Left ureteral calculus obstruction.  2. Left hydronephrosis.  3. Left renal colic.   OPERATION/PROCEDURE:  1. Cystoscopy.  2. Left retrograde pyelogram.  3. Left ureteroscopy.  4. Stone extraction.  5. Left ureteral stent placement.   ANESTHESIA:  General.   SURGEON:  Dennie Maizes, M.D.   COMPLICATIONS:  None.   DRAINS:  A 6-French 36 cm size left ureteral stent with a string.   INDICATIONS FOR PROCEDURE:  This 40 year old male has a history of right  renal lithiasis.  He was admitted to the hospital with severe left flank  pain.  Evaluation revealed left upper and lower ureteral calculus  obstruction and hydronephrosis.  The patient was taken to the operating room  today for cystoscopy, left retrograde pyelogram, possible ureteroscopy,  stone extraction, stent placement.   DESCRIPTION OF PROCEDURE:  General anesthesia was induced and the patient  was placed on the OR table in the dorsal lithotomy position.  The lower  abdomen and genitalia were prepped and draped in the sterile fashion.  The  urethral meatus was dilated up to 24-French with metal sounds.  Cystoscopy  was done with the 22-French scope.  The urethra and prostate were normal.  The appearance of bladder was normal.  There was a lot of stone debris in  the bladder.  Irrigation of the bladder was done and the stone fragments  were removed.   The 5-French wedge catheter was in  placed in the left ureteral orifice.  There were 7 mL of Renografin-60 injected in the collecting system and a  retrograde pyelogram was done by using C-arm fluoroscopy.  There was  moderate dilation of the left ureter.  No definite filling defect was noted.  A 5-French open-ended catheter was then placed in the left ureteral orifice.  A 0.038 inch Bentson guide wire with a flexible tip was then advanced into  the left renal pelvis.  The distal ureter was then dilated using a 4 cm 18-  French balloon dilating catheter.  The balloon dilating catheter was then  removed and the guide wire then placed.  An 8.5 rigid ureteroscope was then  inserted in the distal ureter.  Several stone fragments about 3-4 mm in size  were noted in the distal ureter.  These were extracted with a wire basket.  The guide wire was  left in place and the ureteroscope was removed.  A  ureteral access sheath, 14-16-French, 34 cm in length was then inserted into  the left distal ureter.  The flexible ureteroscope was then inserted in the  renal pelvis.  No definite stone was seen in the renal pelvis.  The entire  ureter was examined well withdrawing the scope.  No residual ureteral  calculi were noted.  The ureteral access sheath was then removed after  inserting the guide wire into the collecting system.  A 6-French 36 cm stent  with a string was then inserted into the left collecting system.  The  instruments were removed.  The patient was transferred to the PACU in a  satisfactory condition.      ___________________________________________                                            Dennie Maizes, M.D.   SK/MEDQ  D:  10/01/2003  T:  10/01/2003  Job:  [id number]

## 2011-02-14 NOTE — H&P (Signed)
NAME:  Dale Contreras, Dale Contreras NO.:  [id number]   MEDICAL RECORD NO.:  [id number]          PATIENT TYPE:  INP   LOCATION:  0103                         FACILITY:  [facility]   PHYSICIAN:  Jackie Plum, M.D.DATE OF BIRTH:  03-Oct-1970   DATE OF ADMISSION:  01/15/2005  DATE OF DISCHARGE:                                HISTORY & PHYSICAL   ADDENDUM:  I just had the opportunity to speak to the former wife of Dale Contreras who happens to be a nurse at [employer].  Her name is Dale Contreras.  She mentioned to me that the patient has been using anabolic  steroids and cocaine, as well as other over-the-counter medications and  other prescription drugs.  If  CPK is elevated, we will treat him for  rhabdomyolysis, which could explain his highly elevated transaminitis.  If  not, will initiate full liver work-up including hepatitis panel, among  others.      GO/MEDQ  D:  01/15/2005  T:  01/15/2005  Job:  [id number]

## 2011-02-14 NOTE — Discharge Summary (Signed)
NAME:  Dale Contreras, Dale Contreras NO.:  [id number]   MEDICAL RECORD NO.:  [id number]          PATIENT TYPE:  INP   LOCATION:  0373                         FACILITY:  [facility]   PHYSICIAN:  Hollice Espy, M.D.DATE OF BIRTH:  17-Aug-1971   DATE OF ADMISSION:  01/15/2005  DATE OF DISCHARGE:  01/19/2005                                 DISCHARGE SUMMARY   PRIMARY CARE PHYSICIAN:  The patient follows up at [facility].   DISCHARGE DIAGNOSES:  1.  Rhabdomyolysis of exact etiology unknown, but suspect to be secondary to      alcohol intoxication and episode of passing out.  2.  Elevated transaminases felt to be secondary to acute alcohol binging      with possibly some component of taking Tylenol.  3.  Alcohol use and question abuse.   DISCHARGE MEDICATIONS:  Motrin 800 mg one p.o. daily.   HOSPITAL COURSE:  The patient is a 40 year old white male with no past  medical history who presented on January 15, 2005, after complaining of  soreness and weakness. He initially denied any heavy alcohol use, however a  family member confided that the patient had been under a lot of severe  stress in the past few months with a recent divorce and had been drinking  very heavily.  He was complaining of hurting all over. His labs of concern  were a CPK level of over 18,000 as well as elevated transaminases of 5101  AST and 4870 ALT respectively. Ultrasound of his liver was unremarkable. He  was noted to have an 8-mm right renal stone which was unobstructing. The  patient was started on IV fluids. Other etiologies of elevated transaminases  were sought including a hepatitis panel which was found to be negative.   In regards to his rhabdomyolysis the patient was continued on high-dose IV  fluids from 160 cc an hour to 200 cc an hour. He continued to respond well  and by January 19, 2005, his CPK level was decreased from an initial 18,700 to  840. His transaminases had decreased  down to 181 AST and 1709 ALT. The  patient, for pain, received Toradol. His BUN and creatinine remained stable.  At this point the patient was ambulating well with no complaints, was  tolerating p.o. He was felt to be medically stable for discharge on January 19, 2005. The plan for him to be discharged home. I have advised him to  avoid any more alcohol, explaining what caused the situation. In addition I  am giving him some Motrin p.r.n. pain, avoiding any Tylenol component until  his LFTs have returned back to normal. He is advised to follow up with  family practice in one week's time for checking of his LFTs and CPK level.  His discharge  diet will be a regular diet, although he is advised to drink a lot of  fluids. The patient is being discharged to home. In regards to his renal  stone, this was unremarkable and with high dose IV fluids the stone should  have, if it has  not already, passed. The patient is not having any  complaints of any flank pain.      SKK/MEDQ  D:  01/19/2005  T:  01/19/2005  Job:  [id number]   cc:   Deboraha Sprang [facility]

## 2011-07-10 LAB — URINALYSIS, ROUTINE W REFLEX MICROSCOPIC
Bilirubin Urine: NEGATIVE
Glucose, UA: 500 — AB
Ketones, ur: NEGATIVE
Nitrite: NEGATIVE
Specific Gravity, Urine: 1.014
pH: 5.5

## 2011-07-14 LAB — URINALYSIS, ROUTINE W REFLEX MICROSCOPIC
Glucose, UA: NEGATIVE
Hgb urine dipstick: NEGATIVE
Specific Gravity, Urine: 1.008
pH: 6

## 2011-07-14 LAB — DIFFERENTIAL
Basophils Absolute: 0.1
Basophils Relative: 1
Eosinophils Relative: 2
Lymphocytes Relative: 14
Monocytes Absolute: 0.6
Neutro Abs: 6.4

## 2011-07-14 LAB — COMPREHENSIVE METABOLIC PANEL
Albumin: 3.8
Alkaline Phosphatase: 74
BUN: 10
CO2: 25
Chloride: 106
Creatinine, Ser: 0.85
GFR calc non Af Amer: >60
Potassium: 3.9
Total Bilirubin: 0.4

## 2011-07-14 LAB — CBC
HCT: 39.5
Hemoglobin: 13.6
MCV: 87.5
Platelets: 384
RBC: 4.51
WBC: 8.4

## 2011-07-14 LAB — LIPASE, BLOOD: Lipase: 17

## 2012-08-28 ENCOUNTER — Encounter (HOSPITAL_COMMUNITY): Payer: Self-pay | Admitting: Emergency Medicine

## 2012-08-28 DIAGNOSIS — R109 Unspecified abdominal pain: Secondary | ICD-10-CM | POA: Insufficient documentation

## 2012-08-28 DIAGNOSIS — E860 Dehydration: Secondary | ICD-10-CM | POA: Insufficient documentation

## 2012-08-28 DIAGNOSIS — K5289 Other specified noninfective gastroenteritis and colitis: Secondary | ICD-10-CM | POA: Insufficient documentation

## 2012-08-28 NOTE — ED Notes (Addendum)
Pt. States that around 7pm tonight he started having severe abdominal pain in his LLQ between his belly button and his left testicle. He states he left kidney hurts. Vomited 8-10 times per his report. States he has diarrhea. Denies fever or chills.

## 2012-08-29 ENCOUNTER — Emergency Department (HOSPITAL_COMMUNITY)
Admission: EM | Admit: 2012-08-29 | Discharge: 2012-08-29 | Disposition: A | Discharge status: Home / Self Care | Payer: Self-pay | Attending: Emergency Medicine | Admitting: Emergency Medicine

## 2012-08-29 ENCOUNTER — Emergency Department (HOSPITAL_COMMUNITY): Payer: Self-pay

## 2012-08-29 DIAGNOSIS — R109 Unspecified abdominal pain: Secondary | ICD-10-CM

## 2012-08-29 DIAGNOSIS — E860 Dehydration: Secondary | ICD-10-CM

## 2012-08-29 DIAGNOSIS — K529 Noninfective gastroenteritis and colitis, unspecified: Secondary | ICD-10-CM

## 2012-08-29 HISTORY — DX: Disorder of kidney and ureter, unspecified: N28.9

## 2012-08-29 LAB — CBC
MCV: 87.4 fL (ref 78.0–100.0)
Platelets: 367 10*3/uL (ref 150–400)
RBC: 4.3 MIL/uL (ref 4.22–5.81)
RDW: 14.9 % (ref 11.5–15.5)
WBC: 15.2 10*3/uL — ABNORMAL HIGH (ref 4.0–10.5)

## 2012-08-29 LAB — CBC WITH DIFFERENTIAL/PLATELET
Basophils Absolute: 0 10*3/uL (ref 0.0–0.1)
Basophils Relative: 0 % (ref 0–1)
Eosinophils Absolute: 0.4 10*3/uL (ref 0.0–0.7)
MCH: 29.7 pg (ref 26.0–34.0)
MCHC: 34.1 g/dL (ref 30.0–36.0)
Neutro Abs: 23 10*3/uL — ABNORMAL HIGH (ref 1.7–7.7)
Neutrophils Relative %: 89 % — ABNORMAL HIGH (ref 43–77)
Platelets: 509 10*3/uL — ABNORMAL HIGH (ref 150–400)
RBC: 5.62 MIL/uL (ref 4.22–5.81)

## 2012-08-29 LAB — URINE MICROSCOPIC-ADD ON

## 2012-08-29 LAB — COMPREHENSIVE METABOLIC PANEL
ALT: 46 U/L (ref 0–53)
AST: 28 U/L (ref 0–37)
Albumin: 5.2 g/dL (ref 3.5–5.2)
Alkaline Phosphatase: 97 U/L (ref 39–117)
Chloride: 98 mEq/L (ref 96–112)
Potassium: 5.4 mEq/L — ABNORMAL HIGH (ref 3.5–5.1)
Sodium: 137 mEq/L (ref 135–145)
Total Protein: 9.1 g/dL — ABNORMAL HIGH (ref 6.0–8.3)

## 2012-08-29 LAB — URINALYSIS, ROUTINE W REFLEX MICROSCOPIC
Bilirubin Urine: NEGATIVE
Glucose, UA: NEGATIVE mg/dL
Hgb urine dipstick: NEGATIVE
Nitrite: NEGATIVE
Specific Gravity, Urine: 1.02 (ref 1.005–1.030)
pH: 5.5 (ref 5.0–8.0)

## 2012-08-29 LAB — LIPASE, BLOOD: Lipase: 43 U/L (ref 11–59)

## 2012-08-29 LAB — POTASSIUM: Potassium: 5.3 mEq/L — ABNORMAL HIGH (ref 3.5–5.1)

## 2012-08-29 MED ORDER — METRONIDAZOLE 500 MG PO TABS: 500.0000 mg | ORAL_TABLET | Freq: Two times a day (BID) | ORAL | Status: DC

## 2012-08-29 MED ORDER — METRONIDAZOLE 500 MG PO TABS
500.0000 mg | ORAL_TABLET | Freq: Once | ORAL | Status: AC
  Administered 2012-08-29: 500 mg via ORAL
  Filled 2012-08-29: qty 1

## 2012-08-29 MED ORDER — MORPHINE SULFATE 4 MG/ML IJ SOLN
6.0000 mg | Freq: Once | INTRAMUSCULAR | Status: AC
  Administered 2012-08-29: 6 mg via INTRAVENOUS
  Filled 2012-08-29: qty 2
  Filled 2012-08-29: qty 1

## 2012-08-29 MED ORDER — HYDROMORPHONE HCL PF 1 MG/ML IJ SOLN
1.0000 mg | Freq: Once | INTRAMUSCULAR | Status: AC
  Administered 2012-08-29: 1 mg via INTRAVENOUS
  Filled 2012-08-29: qty 1

## 2012-08-29 MED ORDER — SODIUM CHLORIDE 0.9 % IV BOLUS (SEPSIS)
1000.0000 mL | INTRAVENOUS | Status: AC
  Administered 2012-08-29: 1000 mL via INTRAVENOUS

## 2012-08-29 MED ORDER — OXYCODONE-ACETAMINOPHEN 5-325 MG PO TABS: 1.0000 | ORAL_TABLET | Freq: Four times a day (QID) | ORAL | Status: DC | PRN

## 2012-08-29 MED ORDER — IOHEXOL 300 MG/ML  SOLN
20.0000 mL | INTRAMUSCULAR | Status: AC
  Administered 2012-08-29: 20 mL via ORAL

## 2012-08-29 MED ORDER — IOHEXOL 300 MG/ML  SOLN
80.0000 mL | Freq: Once | INTRAMUSCULAR | Status: AC | PRN
  Administered 2012-08-29: 80 mL via INTRAVENOUS

## 2012-08-29 MED ORDER — CIPROFLOXACIN HCL 500 MG PO TABS
500.0000 mg | ORAL_TABLET | Freq: Once | ORAL | Status: AC
  Administered 2012-08-29: 500 mg via ORAL
  Filled 2012-08-29: qty 1

## 2012-08-29 MED ORDER — ONDANSETRON HCL 4 MG/2ML IJ SOLN
4.0000 mg | Freq: Once | INTRAMUSCULAR | Status: AC
  Administered 2012-08-29: 4 mg via INTRAVENOUS
  Filled 2012-08-29: qty 2

## 2012-08-29 MED ORDER — SODIUM CHLORIDE 0.9 % IV BOLUS (SEPSIS)
1500.0000 mL | Freq: Once | INTRAVENOUS | Status: AC
  Administered 2012-08-29: 1500 mL via INTRAVENOUS

## 2012-08-29 MED ORDER — CIPROFLOXACIN HCL 500 MG PO TABS: 500.0000 mg | ORAL_TABLET | Freq: Two times a day (BID) | ORAL | Status: DC

## 2012-08-29 MED ORDER — KETOROLAC TROMETHAMINE 30 MG/ML IJ SOLN
30.0000 mg | Freq: Once | INTRAMUSCULAR | Status: AC
  Administered 2012-08-29: 30 mg via INTRAVENOUS
  Filled 2012-08-29 (×2): qty 1

## 2012-08-29 NOTE — ED Notes (Signed)
Report received, assumed care.  

## 2012-08-29 NOTE — ED Notes (Signed)
Pt requesting more pain med. 

## 2012-08-29 NOTE — ED Notes (Signed)
To ct

## 2012-08-29 NOTE — ED Notes (Signed)
The pt is c/o pain edp aware

## 2012-08-29 NOTE — ED Notes (Signed)
Pain med given 

## 2012-08-29 NOTE — ED Notes (Signed)
The pt returned from xray 

## 2012-08-29 NOTE — ED Notes (Signed)
c-t called and he has drank his contrast

## 2012-08-29 NOTE — ED Notes (Signed)
The pt reports that his pain is better but may be coming back.  smiling

## 2012-08-29 NOTE — ED Provider Notes (Signed)
History     CSN: [id number]  Arrival date & time 08/28/12  2349   First MD Initiated Contact with Patient 08/29/12 0011      Chief Complaint  Patient presents with  . Abdominal Pain    (Consider location/radiation/quality/duration/timing/severity/associated sxs/prior treatment) HPI Comments: Dale Contreras presents for evaluation of abdominal and flank pain.  He reports the sudden on set of severe left sided pain that radiates into his scrotum and left testicle at 1900.  It has been associated with sweating, nausea, and vomiting.  He reports having similar pain previously associated with passing kidney stones.  He denies fever, diarrhea, constipation, CP, and respiratory complaints.    The history is provided by the patient. No language interpreter was used.    Past Medical History  Diagnosis Date  . Renal disorder     Past Surgical History  Procedure Date  . Cervical fusion     No family history on file.  History  Substance Use Topics  . Smoking status: Never Smoker   . Smokeless tobacco: Not on file  . Alcohol Use: No      Review of Systems  Constitutional: Positive for diaphoresis. Negative for fever, chills, activity change, appetite change and fatigue.  HENT: Negative for congestion, sore throat, rhinorrhea, trouble swallowing, neck pain, postnasal drip and sinus pressure.   Eyes: Negative.   Respiratory: Negative for cough, chest tightness and shortness of breath.   Cardiovascular: Negative for chest pain, palpitations and leg swelling.  Gastrointestinal: Positive for nausea and vomiting. Negative for diarrhea and constipation.  Genitourinary: Positive for flank pain, decreased urine volume and testicular pain. Negative for dysuria, urgency, frequency, hematuria, scrotal swelling, difficulty urinating and penile pain.  Musculoskeletal: Positive for back pain and arthralgias (chronic neck pain). Negative for myalgias and joint swelling.  Skin: Negative.     Neurological: Negative.   Psychiatric/Behavioral: Negative.     Allergies  Review of patient's allergies indicates no known allergies.  Home Medications   Current Outpatient Rx  Name  Route  Sig  Dispense  Refill  . AMPHETAMINE-DEXTROAMPHETAMINE 30 MG PO TABS   Oral   Take 15 mg by mouth daily as needed. Takes when needed for heavy school workload         . HYDROCODONE-ACETAMINOPHEN 10-325 MG PO TABS   Oral   Take 1 tablet by mouth every 12 (twelve) hours as needed. pain         . ALEVE PO   Oral   Take 2 tablets by mouth every 12 (twelve) hours as needed. pain         . OXYCODONE-ACETAMINOPHEN 5-325 MG PO TABS   Oral   Take 1 tablet by mouth once.         . AMOXICILLIN 500 MG PO CAPS   Oral   Take 500 mg by mouth 3 (three) times daily.           BP 129/110  Pulse 53  Temp 97.5 F (36.4 C) (Oral)  Resp 22  SpO2 92%  Physical Exam  Nursing note and vitals reviewed. Constitutional: He is oriented to person, place, and time. He appears well-developed and well-nourished. No distress.  HENT:  Head: Normocephalic and atraumatic.  Right Ear: External ear normal.  Left Ear: External ear normal.  Nose: Nose normal.  Mouth/Throat: Oropharynx is clear and moist. No oropharyngeal exudate.  Eyes: Conjunctivae normal are normal. Pupils are equal, round, and reactive to light. Right eye exhibits no discharge. Left  eye exhibits no discharge. No scleral icterus.  Neck: Normal range of motion. No JVD present. No tracheal deviation present.  Cardiovascular: Regular rhythm, S1 normal, S2 normal, normal heart sounds and intact distal pulses.   No extrasystoles are present. Bradycardia present.  PMI is not displaced.  Exam reveals no gallop, no friction rub and no decreased pulses.   No murmur heard. Pulmonary/Chest: Effort normal and breath sounds normal. No stridor. No respiratory distress. He has no wheezes. He has no rales. He exhibits no tenderness.  Abdominal: Soft.  Normal appearance and bowel sounds are normal. He exhibits no distension, no abdominal bruit, no ascites, no pulsatile midline mass and no mass. There is no hepatosplenomegaly. There is generalized tenderness. There is no rigidity, no rebound, no guarding, no CVA tenderness, no tenderness at McBurney's point and negative Murphy's sign. No hernia.  Musculoskeletal: Normal range of motion. He exhibits no edema and no tenderness.       No CVAT   Lymphadenopathy:    He has no cervical adenopathy.  Neurological: He is alert and oriented to person, place, and time. No cranial nerve deficit.  Skin: Skin is warm and dry. No rash noted. He is not diaphoretic. No erythema. No pallor.  Psychiatric: He has a normal mood and affect. His behavior is normal.    ED Course  Procedures (including critical care time)   Labs Reviewed  CBC WITH DIFFERENTIAL  COMPREHENSIVE METABOLIC PANEL  LIPASE, BLOOD  URINALYSIS, ROUTINE W REFLEX MICROSCOPIC   No results found.   No diagnosis found.    MDM  Pt presents for evaluation of abdominal and left flank pain radiating into his left testicle.  He appears uncomfortable, note no abdominal rigidity, NAD.  He reports a history of kidney stones and states he has passed them previously and required lithotripsy twice.  Will obtain routine belly labs.  While awaiting results will manage discomfort with toradol, morphine, and zofran.  Will reassess and if necessary pursue imaging.  1610.  Note elevated BUN/creatinine as well as significant Leukocytosis.  He does not have evidence of a UTI or hematuria.  Pain is better managed after 6 mg  Morphine and 1 mg dilaudid.  Ordered a contrasted abdominal CT scan.   9604.  Repeat CBC demonstrates improvement in the WBC count.  CT demonstrates evidence of minimal bowl wall thickening.  There is no evidence of a ureteral stone.  His pain is improved and he his exam is not consistent with peritonitis.  Plan pain mgmnt and a course of  flagyl/cipro.  Encouraged outpt follow-up.     Tobin Chad, MD 08/29/12 [phone]

## 2012-08-31 ENCOUNTER — Observation Stay (HOSPITAL_COMMUNITY)
Admission: EM | Admit: 2012-08-31 | Discharge: 2012-09-01 | Disposition: A | Discharge status: Home / Self Care | Payer: Self-pay | Attending: Emergency Medicine | Admitting: Emergency Medicine

## 2012-08-31 ENCOUNTER — Encounter (HOSPITAL_COMMUNITY): Payer: Self-pay | Admitting: *Deleted

## 2012-08-31 DIAGNOSIS — R112 Nausea with vomiting, unspecified: Secondary | ICD-10-CM | POA: Insufficient documentation

## 2012-08-31 DIAGNOSIS — R197 Diarrhea, unspecified: Secondary | ICD-10-CM | POA: Insufficient documentation

## 2012-08-31 DIAGNOSIS — R109 Unspecified abdominal pain: Principal | ICD-10-CM | POA: Insufficient documentation

## 2012-08-31 LAB — COMPREHENSIVE METABOLIC PANEL
ALT: 19 U/L (ref 0–53)
AST: 14 U/L (ref 0–37)
Alkaline Phosphatase: 73 U/L (ref 39–117)
CO2: 23 mEq/L (ref 19–32)
Calcium: 9 mg/dL (ref 8.4–10.5)
Chloride: 103 mEq/L (ref 96–112)
GFR calc Af Amer: >90 mL/min (ref >90)
GFR calc non Af Amer: 88 mL/min — ABNORMAL LOW (ref >90)
Glucose, Bld: 100 mg/dL — ABNORMAL HIGH (ref 70–99)
Potassium: 3.8 mEq/L (ref 3.5–5.1)
Sodium: 139 mEq/L (ref 135–145)
Total Bilirubin: 0.1 mg/dL — ABNORMAL LOW (ref 0.3–1.2)

## 2012-08-31 LAB — CBC
Hemoglobin: 13.1 g/dL (ref 13.0–17.0)
Platelets: 385 10*3/uL (ref 150–400)
RBC: 4.5 MIL/uL (ref 4.22–5.81)
WBC: 6.4 10*3/uL (ref 4.0–10.5)

## 2012-08-31 MED ORDER — MORPHINE SULFATE 10 MG/ML IJ SOLN
2.0000 mg | INTRAMUSCULAR | Status: DC | PRN
  Administered 2012-09-01 (×2): 2 mg via INTRAVENOUS
  Filled 2012-08-31 (×2): qty 1

## 2012-08-31 MED ORDER — ACETAMINOPHEN 325 MG PO TABS: 650.0000 mg | ORAL_TABLET | ORAL | Status: DC | PRN

## 2012-08-31 MED ORDER — ONDANSETRON HCL 4 MG/2ML IJ SOLN
4.0000 mg | Freq: Once | INTRAMUSCULAR | Status: AC
  Administered 2012-08-31: 4 mg via INTRAVENOUS
  Filled 2012-08-31: qty 2

## 2012-08-31 MED ORDER — SODIUM CHLORIDE 0.9 % IV BOLUS (SEPSIS)
1000.0000 mL | Freq: Once | INTRAVENOUS | Status: AC
  Administered 2012-09-01: 1000 mL via INTRAVENOUS

## 2012-08-31 MED ORDER — SODIUM CHLORIDE 0.9 % IV SOLN
1000.0000 mL | INTRAVENOUS | Status: DC
  Administered 2012-08-31: 1000 mL via INTRAVENOUS

## 2012-08-31 MED ORDER — GI COCKTAIL ~~LOC~~: 30.0000 mL | ORAL | Status: DC | PRN

## 2012-08-31 MED ORDER — ONDANSETRON HCL 4 MG/2ML IJ SOLN: 4.0000 mg | Freq: Four times a day (QID) | INTRAMUSCULAR | Status: DC | PRN

## 2012-08-31 MED ORDER — HYDROMORPHONE HCL PF 1 MG/ML IJ SOLN
1.0000 mg | Freq: Once | INTRAMUSCULAR | Status: AC
  Administered 2012-08-31: 1 mg via INTRAVENOUS
  Filled 2012-08-31: qty 1

## 2012-08-31 MED ORDER — SODIUM CHLORIDE 0.9 % IV BOLUS (SEPSIS)
1000.0000 mL | Freq: Once | INTRAVENOUS | Status: AC
  Administered 2012-08-31: 1000 mL via INTRAVENOUS

## 2012-08-31 MED ORDER — DIPHENOXYLATE-ATROPINE 2.5-0.025 MG PO TABS
2.0000 | ORAL_TABLET | ORAL | Status: AC
  Administered 2012-08-31: 2 via ORAL
  Filled 2012-08-31: qty 2

## 2012-08-31 NOTE — ED Notes (Signed)
Patient was seen here on 11/30 for abdominal pain.  Patient was sent home and diagnosed with colitis.  Patient states that his abdomen is still hurting especially on the right side and he is still having diarrhea

## 2012-08-31 NOTE — ED Provider Notes (Signed)
History     CSN: [id number]  Arrival date & time 08/31/12  2106   First MD Initiated Contact with Patient 08/31/12 2310      Chief Complaint  Patient presents with  . Abdominal Pain    (Consider location/radiation/quality/duration/timing/severity/associated sxs/prior treatment) HPI Comments: 41 year old male with a history of no significant past medical history who presents with a complaint of abdominal pain which has been ongoing for several days. The patient was seen 3 days ago and diagnosed with colitis after a CT scan showed mild bowel wall thickening in the presence of the lumen is watery diarrhea and frequent vomiting. Since being seen in the emergency department he has improved with his pain currently it is 5/10, right of the umbilicus, nonradiating and not associated with fevers or chills. He is having persistent watery diarrhea which seems to be slowing down but is still present. He also has had several episodes of vomiting today but he has been able to hold down his antibiotics. He does have a distant history of pyloric stenosis as a child, this was repaired but he has had no other abdominal surgery. He has no history of inguinal or other abdominal hernias and has had no pain in his testicles today. He states that he has had a very small amount of urine today amounting to less than half of a cup just prior to arrival he states that it was very dark brown in appearance.  Patient is a 41 y.o. male presenting with abdominal pain. The history is provided by the patient, a relative and medical records.  Abdominal Pain The primary symptoms of the illness include abdominal pain.    Past Medical History  Diagnosis Date  . Renal disorder     Past Surgical History  Procedure Date  . Cervical fusion     History reviewed. No pertinent family history.  History  Substance Use Topics  . Smoking status: Never Smoker   . Smokeless tobacco: Not on file  . Alcohol Use: No       Review of Systems  Gastrointestinal: Positive for abdominal pain.  All other systems reviewed and are negative.    Allergies  Review of patient's allergies indicates no known allergies.  Home Medications   Current Outpatient Rx  Name  Route  Sig  Dispense  Refill  . AMPHETAMINE-DEXTROAMPHETAMINE 30 MG PO TABS   Oral   Take 15 mg by mouth daily as needed. Takes when needed for heavy school workload         . CIPROFLOXACIN HCL 500 MG PO TABS   Oral   Take 1 tablet (500 mg total) by mouth 2 (two) times daily.   14 tablet   0   . METRONIDAZOLE 500 MG PO TABS   Oral   Take 1 tablet (500 mg total) by mouth 2 (two) times daily.   14 tablet   0   . ALEVE PO   Oral   Take 2 tablets by mouth every 12 (twelve) hours as needed. pain         . OXYCODONE-ACETAMINOPHEN 5-325 MG PO TABS   Oral   Take 1 tablet by mouth every 6 (six) hours as needed for pain.   16 tablet   0   . ONDANSETRON 4 MG PO TBDP   Oral   Take 1 tablet (4 mg total) by mouth every 8 (eight) hours as needed for nausea.   10 tablet   0   . PROMETHAZINE HCL 25  MG PO TABS   Oral   Take 1 tablet (25 mg total) by mouth every 6 (six) hours as needed for nausea.   12 tablet   0     BP 117/61  Pulse 74  Temp 97.1 F (36.2 C) (Oral)  Resp 14  SpO2 98%  Physical Exam  Nursing note and vitals reviewed. Constitutional: He appears well-developed and well-nourished. No distress.  HENT:  Head: Normocephalic and atraumatic.  Mouth/Throat: No oropharyngeal exudate.       Mucous membranes dehydrated  Eyes: Conjunctivae normal and EOM are normal. Pupils are equal, round, and reactive to light. Right eye exhibits no discharge. Left eye exhibits no discharge. No scleral icterus.  Neck: Normal range of motion. Neck supple. No JVD present. No thyromegaly present.  Cardiovascular: Regular rhythm, normal heart sounds and intact distal pulses.  Exam reveals no gallop and no friction rub.   No murmur  heard.      Mild tachycardia to 110, strong peripheral pulses, normal capillary refill.  Pulmonary/Chest: Effort normal and breath sounds normal. No respiratory distress. He has no wheezes. He has no rales.  Abdominal: Soft. He exhibits no distension and no mass. There is tenderness ( Mild right periumbilical tenderness, no peritoneal signs, no tympanitic sounds to percussion, no right upper tenderness, no guarding, no masses).       Increased bowel sounds  Genitourinary:       Testicles descended bilaterally, no testicular tenderness or masses, no inguinal hernias palpated, normal-appearing circumcised penis, no discharge from the urethral meatus  Musculoskeletal: Normal range of motion. He exhibits no edema and no tenderness.  Lymphadenopathy:    He has no cervical adenopathy.  Neurological: He is alert. Coordination normal.  Skin: Skin is warm and dry. No rash noted. No erythema.  Psychiatric: He has a normal mood and affect. His behavior is normal.    ED Course  Procedures (including critical care time)  Labs Reviewed  CBC - Abnormal; Notable for the following:    HCT 38.4 (*)     All other components within normal limits  COMPREHENSIVE METABOLIC PANEL - Abnormal; Notable for the following:    Glucose, Bld 100 (*)     Total Bilirubin 0.1 (*)     GFR calc non Af Amer 88 (*)     All other components within normal limits  URINALYSIS, ROUTINE W REFLEX MICROSCOPIC - Abnormal; Notable for the following:    Color, Urine ORANGE (*)  BIOCHEMICALS MAY BE AFFECTED BY COLOR   APPearance CLOUDY (*)     Specific Gravity, Urine 1.039 (*)     Bilirubin Urine SMALL (*)     Ketones, ur 15 (*)     Leukocytes, UA SMALL (*)     All other components within normal limits  URINE MICROSCOPIC-ADD ON - Abnormal; Notable for the following:    Bacteria, UA MANY (*)     All other components within normal limits  CK  URINE CULTURE   No results found.   1. Nausea vomiting and diarrhea   2. Abdominal  pain       MDM  The patient has ongoing dehydration related to a gastrointestinal illness, there is no bloody diarrhea, his laboratory values have improved significantly compared to his prior visit where he had a white blood cell count of 15,000. Today it is 6400 and his electrolytes are normal including his renal function. Will address the dark low-volume urine with aggressive rehydration, symptomatic treatment of nausea and  abdominal discomfort and will place on dehydration protocol in the observation unit overnight. Overall the patient appears stable.  Pt placed on observation protocol and continued getting meds and fluids with good improvement.  UA with dehydration, cultures sent due to many bac.  High SG, mild ketonuria.  No rhabdo  N/v/d much improved after fluids - tolerating breakfast at this time - no n/v, mild abd cramp to LLQ.  Appears stable and much improved.  zofran and phenergan for home.     Vida Roller, MD 09/01/12 [phone]

## 2012-08-31 NOTE — ED Notes (Signed)
Patient seen here Sat for diarrhea.  Sent home and took meds as directed.  Continued with diarrhea and states has not voided for about 24 hours.

## 2012-09-01 LAB — URINALYSIS, ROUTINE W REFLEX MICROSCOPIC
Hgb urine dipstick: NEGATIVE
Ketones, ur: 15 mg/dL — AB
Specific Gravity, Urine: 1.039 — ABNORMAL HIGH (ref 1.005–1.030)
Urobilinogen, UA: 1 mg/dL (ref 0.0–1.0)

## 2012-09-01 LAB — URINE MICROSCOPIC-ADD ON

## 2012-09-01 MED ORDER — ONDANSETRON 4 MG PO TBDP: 4.0000 mg | ORAL_TABLET | Freq: Three times a day (TID) | ORAL | Status: DC | PRN

## 2012-09-01 MED ORDER — OXYCODONE-ACETAMINOPHEN 5-325 MG PO TABS: 1.0000 | ORAL_TABLET | ORAL | Status: DC | PRN

## 2012-09-01 MED ORDER — PROMETHAZINE HCL 25 MG PO TABS: 25.0000 mg | ORAL_TABLET | Freq: Four times a day (QID) | ORAL | Status: DC | PRN

## 2012-09-02 LAB — URINE CULTURE: Colony Count: NO GROWTH

## 2012-09-07 ENCOUNTER — Emergency Department (INDEPENDENT_AMBULATORY_CARE_PROVIDER_SITE_OTHER)
Admission: EM | Admit: 2012-09-07 | Discharge: 2012-09-07 | Disposition: A | Discharge status: Home / Self Care | Payer: Self-pay | Source: Home / Self Care | Attending: Emergency Medicine | Admitting: Emergency Medicine

## 2012-09-07 ENCOUNTER — Encounter (HOSPITAL_COMMUNITY): Payer: Self-pay | Admitting: *Deleted

## 2012-09-07 DIAGNOSIS — K5289 Other specified noninfective gastroenteritis and colitis: Secondary | ICD-10-CM

## 2012-09-07 DIAGNOSIS — R197 Diarrhea, unspecified: Secondary | ICD-10-CM

## 2012-09-07 DIAGNOSIS — K529 Noninfective gastroenteritis and colitis, unspecified: Secondary | ICD-10-CM

## 2012-09-07 HISTORY — DX: Noninfective gastroenteritis and colitis, unspecified: K52.9

## 2012-09-07 MED ORDER — TRAMADOL HCL 50 MG PO TABS: 50.0000 mg | ORAL_TABLET | Freq: Four times a day (QID) | ORAL | Status: DC | PRN

## 2012-09-07 MED ORDER — CIPROFLOXACIN HCL 500 MG PO TABS: 500.0000 mg | ORAL_TABLET | Freq: Two times a day (BID) | ORAL | Status: AC

## 2012-09-07 MED ORDER — METRONIDAZOLE 500 MG PO TABS: 500.0000 mg | ORAL_TABLET | Freq: Two times a day (BID) | ORAL | Status: AC

## 2012-09-07 NOTE — ED Notes (Signed)
Pt was in the [facility] 10 days ago.  He is asking for refills of his meds.  He also c/o diarrhea --about 10 watery stools a day the past 10 days.

## 2012-09-07 NOTE — ED Provider Notes (Signed)
History     CSN: [id number]  Arrival date & time 09/07/12  1430   First MD Initiated Contact with Patient 09/07/12 1532      Chief Complaint  Patient presents with  . Medication Refill    (Consider location/radiation/quality/duration/timing/severity/associated sxs/prior treatment) HPI Comments: Patient presents urgent care complaining that after he completed his 10 day antibiotic treatment about last Saturday he was doing remarkably better but since Sunday he started again having diarrheas that he describes as 6-10 episodes a day with cramping pains. He has not had a form stool since his visit to the emergency department about 10 days ago. He denies any fevers denies any pain at rest. Denies any blood in his stools.  The history is provided by the patient.    Past Medical History  Diagnosis Date  . Renal disorder   . Colitis     Past Surgical History  Procedure Date  . Cervical fusion     Family History  Problem Relation Age of Onset  . COPD Father     History  Substance Use Topics  . Smoking status: Never Smoker   . Smokeless tobacco: Not on file  . Alcohol Use: No      Review of Systems  Constitutional: Positive for activity change and appetite change. Negative for fever, chills, diaphoresis and fatigue.  Gastrointestinal: Positive for abdominal pain, diarrhea and constipation. Negative for nausea, blood in stool, anal bleeding and rectal pain.  Skin: Negative for rash.  Neurological: Negative for dizziness.    Allergies  Review of patient's allergies indicates no known allergies.  Home Medications   Current Outpatient Rx  Name  Route  Sig  Dispense  Refill  . AMPHETAMINE-DEXTROAMPHETAMINE 30 MG PO TABS   Oral   Take 15 mg by mouth daily as needed. Takes when needed for heavy school workload         . OXYCODONE-ACETAMINOPHEN 5-325 MG PO TABS   Oral   Take 1 tablet by mouth every 6 (six) hours as needed for pain.   16 tablet   0   .  OXYCODONE-ACETAMINOPHEN 5-325 MG PO TABS   Oral   Take 1 tablet by mouth every 4 (four) hours as needed for pain.   20 tablet   0   . PROMETHAZINE HCL 25 MG PO TABS   Oral   Take 1 tablet (25 mg total) by mouth every 6 (six) hours as needed for nausea.   12 tablet   0   . CIPROFLOXACIN HCL 500 MG PO TABS   Oral   Take 1 tablet (500 mg total) by mouth 2 (two) times daily.   14 tablet   0   . METRONIDAZOLE 500 MG PO TABS   Oral   Take 1 tablet (500 mg total) by mouth 2 (two) times daily.   14 tablet   0   . ONDANSETRON 4 MG PO TBDP   Oral   Take 1 tablet (4 mg total) by mouth every 8 (eight) hours as needed for nausea.   10 tablet   0   . TRAMADOL HCL 50 MG PO TABS   Oral   Take 1 tablet (50 mg total) by mouth every 6 (six) hours as needed for pain.   10 tablet   0     BP 136/79  Pulse 88  Temp 97.6 F (36.4 C) (Oral)  Resp 20  SpO2 100%  Physical Exam  Nursing note and vitals reviewed. Constitutional: Vital signs  are normal. He appears well-developed and well-nourished.  Non-toxic appearance. He does not have a sickly appearance. He does not appear ill. No distress.  Pulmonary/Chest: Effort normal and breath sounds normal.  Abdominal: Soft. Bowel sounds are normal. He exhibits no distension and no mass. There is no tenderness. There is no rebound and no guarding.       Unable to reproduce any pain with deep palpation.    ED Course  Procedures (including critical care time)  Labs Reviewed - No data to display No results found.   1. Colitis   2. Diarrhea       MDM  Colitis nonspecific diarrhea. Have agreed to start patient on a second cycle of both Cipro and Flagyl and have agreed to give him 5 tablets of Ultram as he is requesting some pain medicine have also encouraged him to followup with gastroenterologist for further evaluation of his colitis. Patient agrees with treatment plan and followup care per we have also discussed what symptoms should  warrant him to go to the emergency department for further evaluation. He agrees and understands.        Jimmie Molly, MD 09/07/12 [phone]

## 2012-10-24 ENCOUNTER — Emergency Department (HOSPITAL_COMMUNITY)
Admission: EM | Admit: 2012-10-24 | Discharge: 2012-10-24 | Disposition: A | Discharge status: Home / Self Care | Payer: Self-pay | Attending: Emergency Medicine | Admitting: Emergency Medicine

## 2012-10-24 ENCOUNTER — Encounter (HOSPITAL_COMMUNITY): Payer: Self-pay | Admitting: Emergency Medicine

## 2012-10-24 DIAGNOSIS — M542 Cervicalgia: Secondary | ICD-10-CM | POA: Insufficient documentation

## 2012-10-24 DIAGNOSIS — Z87448 Personal history of other diseases of urinary system: Secondary | ICD-10-CM | POA: Insufficient documentation

## 2012-10-24 DIAGNOSIS — Z8739 Personal history of other diseases of the musculoskeletal system and connective tissue: Secondary | ICD-10-CM | POA: Insufficient documentation

## 2012-10-24 DIAGNOSIS — Z8719 Personal history of other diseases of the digestive system: Secondary | ICD-10-CM | POA: Insufficient documentation

## 2012-10-24 DIAGNOSIS — M549 Dorsalgia, unspecified: Secondary | ICD-10-CM | POA: Insufficient documentation

## 2012-10-24 DIAGNOSIS — G8929 Other chronic pain: Secondary | ICD-10-CM

## 2012-10-24 MED ORDER — HYDROCODONE-ACETAMINOPHEN 5-325 MG PO TABS
2.0000 | ORAL_TABLET | Freq: Once | ORAL | Status: AC
  Administered 2012-10-24: 2 via ORAL
  Filled 2012-10-24: qty 2

## 2012-10-24 MED ORDER — HYDROCODONE-ACETAMINOPHEN 5-325 MG PO TABS: 1.0000 | ORAL_TABLET | Freq: Four times a day (QID) | ORAL | Status: DC | PRN

## 2012-10-24 NOTE — ED Provider Notes (Signed)
History     CSN: [id number]  Arrival date & time 10/24/12  4098   First MD Initiated Contact with Patient 10/24/12 1000      Chief Complaint  Patient presents with  . Neck Pain    (Consider location/radiation/quality/duration/timing/severity/associated sxs/prior treatment) HPI Comments: 42 y/o male with a history of spinal fusion at c5-c7 in 2006 presents to the ED complaining of neck and back pain x 1 week. Describes pain as sharp and stabbing, radiating to his low back. He tried icing, heat, naproxen, tylenol and ibuprofen without relief. States he does get relief from Norco prescribed by Dr. Mayford Knife, however he has not been able to follow up due to money issues. Pain exacerbated by cold weather which is normal for when his chronic pain flares up. Denies numbness or tingling down extremities, headache, weakness, blurred vision, nausea or vomiting.  Patient is a 42 y.o. male presenting with neck pain. The history is provided by the patient.  Neck Pain  Pertinent negatives include no numbness and no weakness.    Past Medical History  Diagnosis Date  . Renal disorder   . Colitis     Past Surgical History  Procedure Date  . Cervical fusion     Family History  Problem Relation Age of Onset  . COPD Father     History  Substance Use Topics  . Smoking status: Never Smoker   . Smokeless tobacco: Not on file  . Alcohol Use: No      Review of Systems  HENT: Positive for neck pain.   Gastrointestinal: Negative for nausea and vomiting.  Musculoskeletal: Positive for back pain.  Neurological: Negative for weakness and numbness.  All other systems reviewed and are negative.    Allergies  Review of patient's allergies indicates no known allergies.  Home Medications   Current Outpatient Rx  Name  Route  Sig  Dispense  Refill  . BC HEADACHE POWDER PO   Oral   Take 1 Package by mouth 4 (four) times daily as needed. For pain.         Marland Kitchen HYDROCODONE-ACETAMINOPHEN  5-325 MG PO TABS   Oral   Take 1-2 tablets by mouth every 6 (six) hours as needed for pain.   6 tablet   0     BP 136/86  Pulse 88  Temp 97.7 F (36.5 C) (Oral)  Resp 16  SpO2 100%  Physical Exam  Nursing note and vitals reviewed. Constitutional: He is oriented to person, place, and time. He appears well-developed and well-nourished. No distress.  HENT:  Head: Normocephalic and atraumatic.  Mouth/Throat: Oropharynx is clear and moist.  Eyes: Conjunctivae normal and EOM are normal. Pupils are equal, round, and reactive to light.  Neck: Normal range of motion. Neck supple. No spinous process tenderness and no muscular tenderness present. No rigidity. No edema and no erythema present.  Cardiovascular: Normal rate, regular rhythm, normal heart sounds and intact distal pulses.   Pulmonary/Chest: Effort normal and breath sounds normal.  Musculoskeletal:       Thoracic back: Normal. He exhibits no tenderness.       Lumbar back: He exhibits no tenderness.  Neurological: He is alert and oriented to person, place, and time. He has normal strength. No sensory deficit. Gait normal.  Skin: Skin is warm and dry.  Psychiatric: He has a normal mood and affect. His behavior is normal.    ED Course  Procedures (including critical care time)  Labs Reviewed - No data  to display No results found.   1. Chronic neck pain       MDM  42 y/o male with chronic neck pain presenting with exacerbation of his chronic pain. States this happens when weather is cold. He has full ROM of his neck and is non tender to palpation. Neuro exam unremarkable. He received 30 vicodin from Dr. Mayford Knife on December 23. I advised him to call to schedule an appointment. 2 vicodin given in ED along with a prescription for 6 vicodin at discharge. Patient states understanding of plan and is agreeable.        Trevor Mace, PA-C 10/24/12 1054
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
## 2012-10-24 NOTE — ED Provider Notes (Signed)
Medical screening examination/treatment/procedure(s) were performed by non-physician practitioner and as supervising physician I was immediately available for consultation/collaboration.  Kadan Millstein T Desire Fulp, MD 10/24/12 1601 

## 2013-07-24 IMAGING — CT CT ABD-PELV W/ CM
2 of 5 series · 14 of 32 positions shown, 19 images · IV contrast (water/omni  & 80ml omni 300)
Comparison: CT of the abdomen and pelvis performed 05/06/2007

CLINICAL DATA: Severe left-sided abdominal pain.  Vomiting and
diarrhea.

CT ABDOMEN AND PELVIS WITH CONTRAST
TECHNIQUE: Multidetector CT imaging of the abdomen and pelvis was
performed following the standard protocol during bolus
administration of intravenous contrast.
Contrast: 80mL OMNIPAQUE IOHEXOL 300 MG/ML  SOLN

[Series 2: routine abdomen · axial · 0.70mm/px · z∈[-432,-82]mm · 8 of 91 slices shown, 13 images]
[im 11/91  soft-tissue]
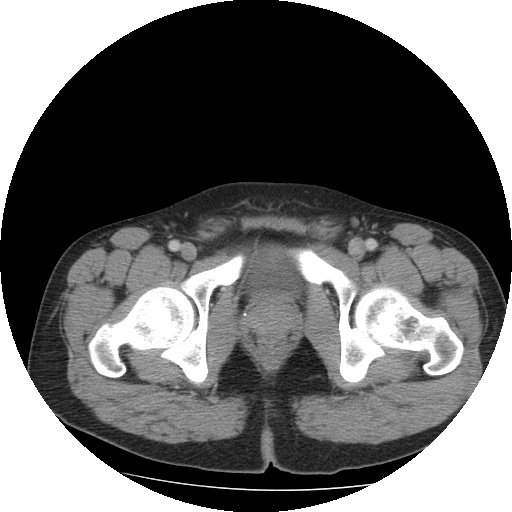
[im 11/91  bone]
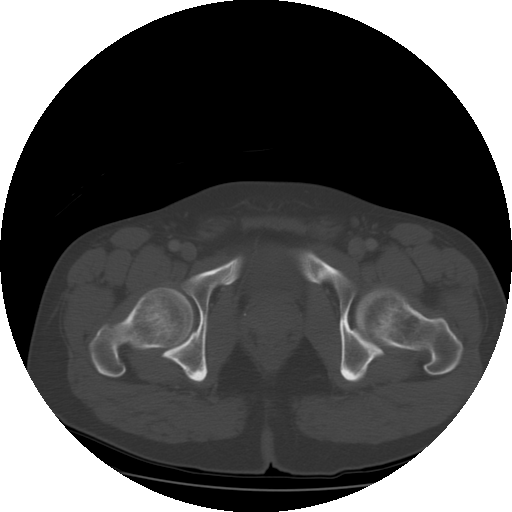
[im 21/91  soft-tissue]
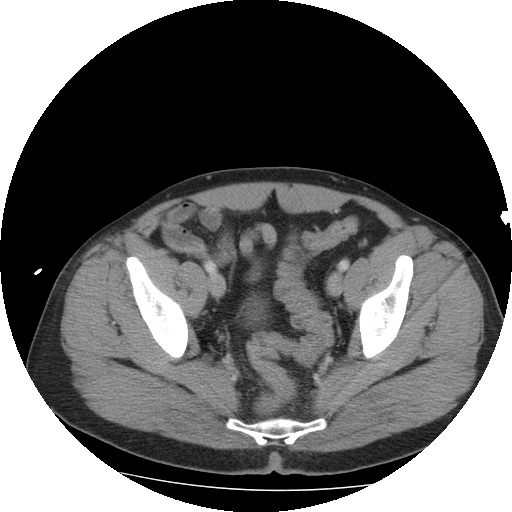
[im 31/91  soft-tissue]
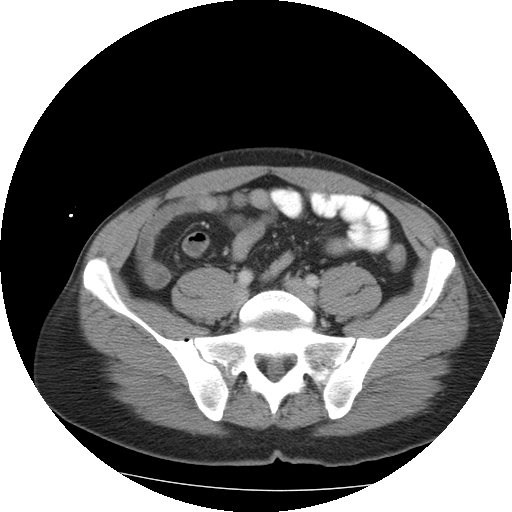
[im 41/91  soft-tissue]
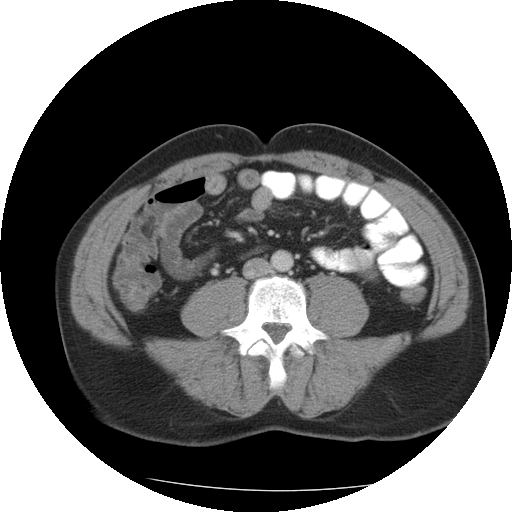
[im 51/91  soft-tissue]
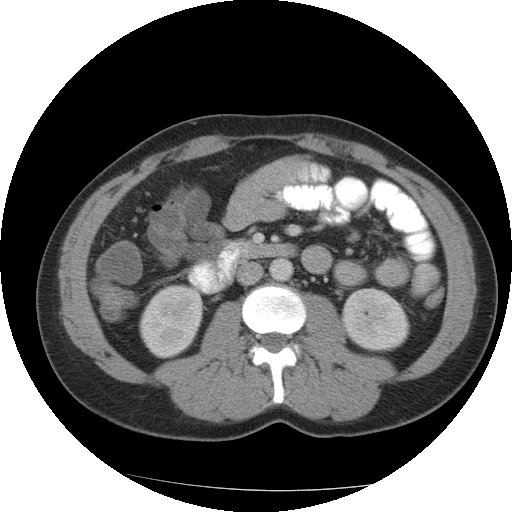
[im 51/91  lung]
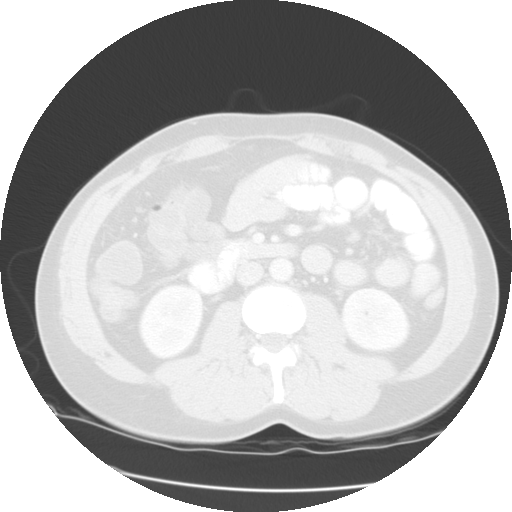
[im 61/91  soft-tissue]
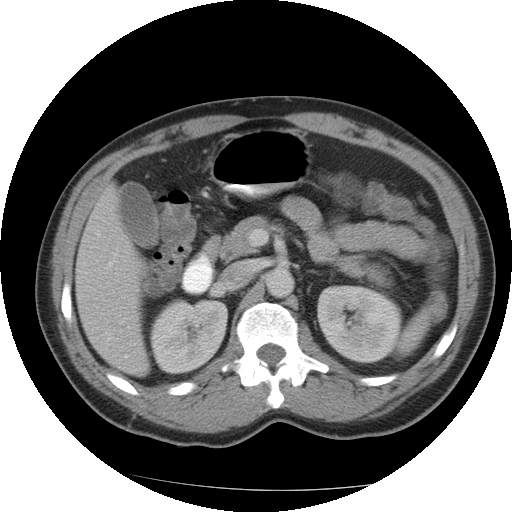
[im 61/91  lung]
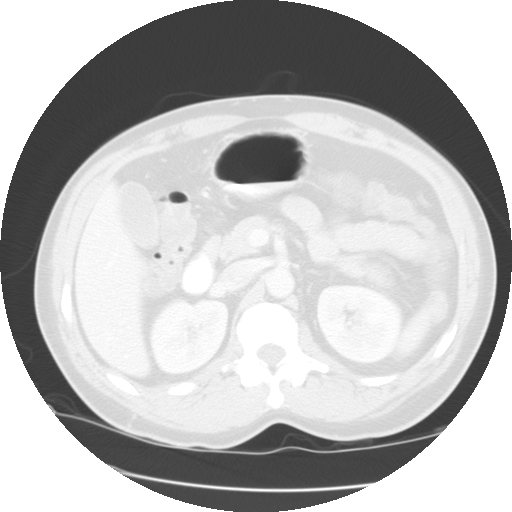
[im 71/91  soft-tissue]
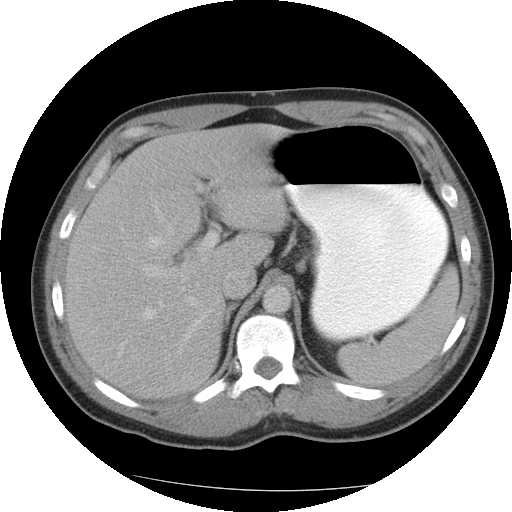
[im 71/91  lung]
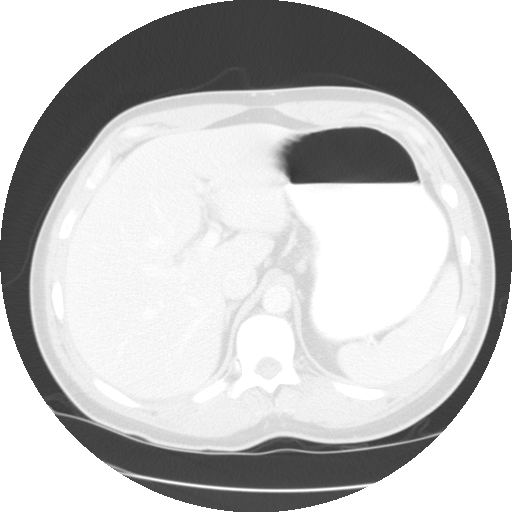
[im 81/91  soft-tissue]
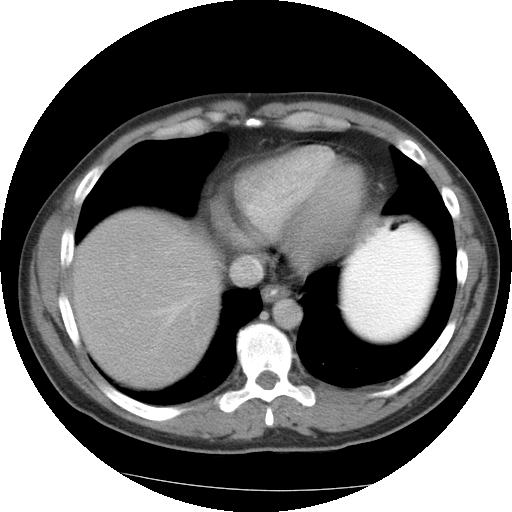
[im 81/91  lung]
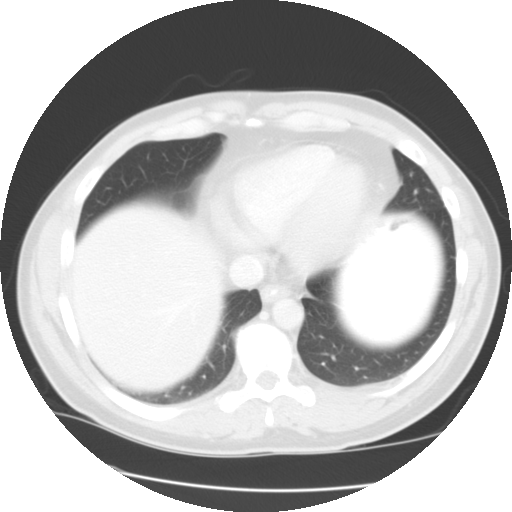

[Series 401: sagittals · sagittal · 0.90mm/px · 6 of 102 slices shown]
[im 12/102  soft-tissue]
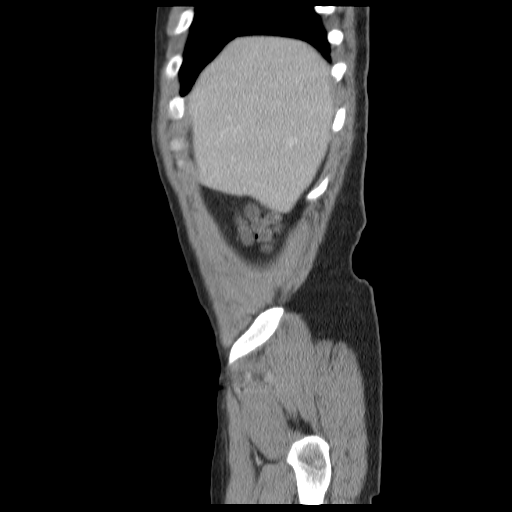
[im 23/102  soft-tissue]
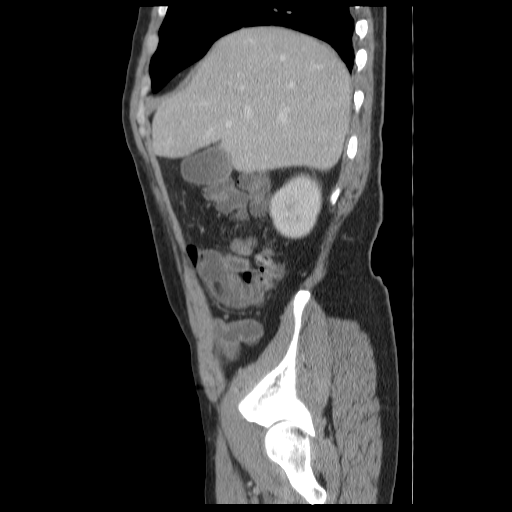
[im 34/102  soft-tissue]
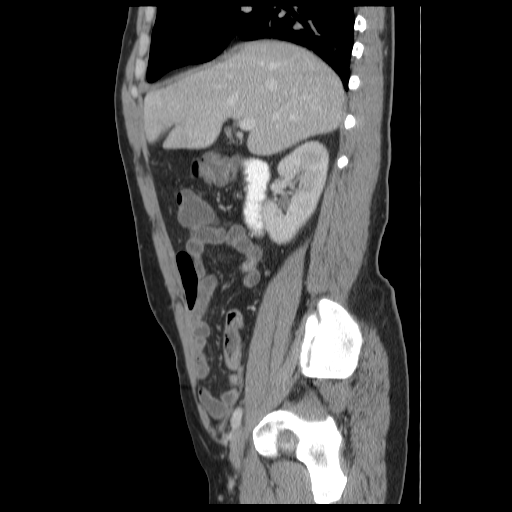
[im 45/102  soft-tissue]
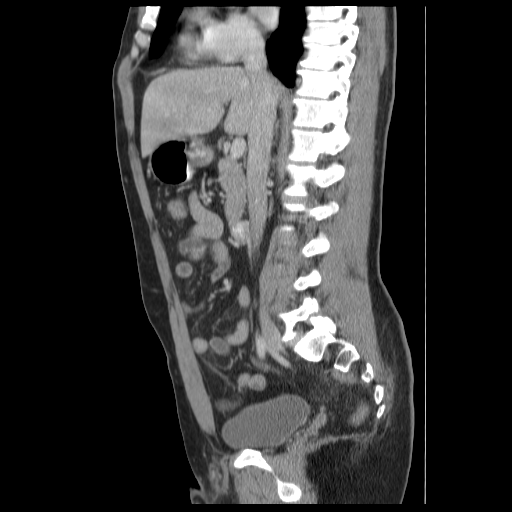
[im 57/102  soft-tissue]
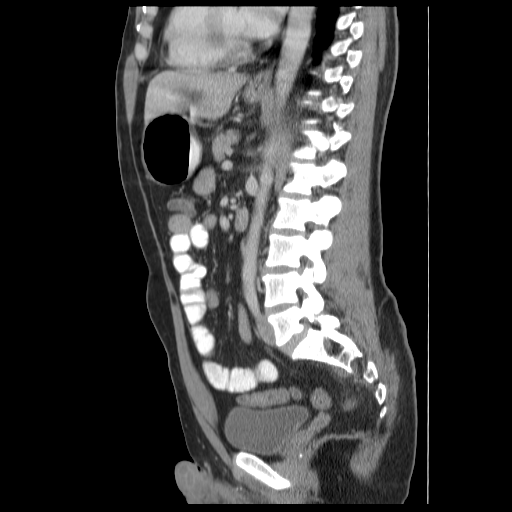
[im 68/102  soft-tissue]
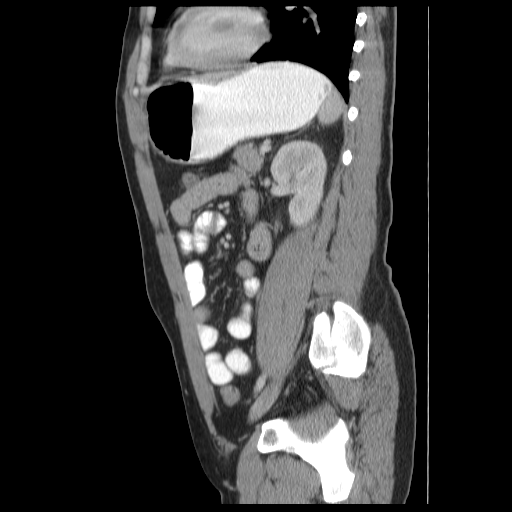

[14 of 32 positions shown; findings below may reference images not displayed]

FINDINGS: The visualized lung bases are clear.

The liver and spleen are unremarkable in appearance.  The
gallbladder is within normal limits.  The pancreas and adrenal
glands are unremarkable.

The kidneys are unremarkable in appearance.  There is no evidence
of hydronephrosis.  No renal or ureteral stones are seen.  No
perinephric stranding is appreciated.

No free fluid is identified.  The small bowel is unremarkable in
appearance.  The stomach is within normal limits.  No acute
vascular abnormalities are seen.

The appendix is normal in caliber; there is no evidence for
appendicitis.  The colon is largely decompressed, containing a
small amount of fluid, with question of minimal wall thickening
particularly along the sigmoid colon.

The bladder is mildly distended and grossly unremarkable.  The
prostate remains normal in size.  No inguinal lymphadenopathy is
seen.

No acute osseous abnormalities are identified.  Chronic bilateral
pars defects are noted L5, without evidence of significant
anterolisthesis.
IMPRESSION: 1.  The colon is largely decompressed, though partially filled with
fluid, with question of minimal wall thickening.  This may be
within normal limits, though a subtle mild infectious process could
have a similar appearance.
2.  Chronic bilateral pars defects at L5, without significant
anterolisthesis.

## 2013-10-06 ENCOUNTER — Emergency Department (HOSPITAL_COMMUNITY)
Admission: EM | Admit: 2013-10-06 | Discharge: 2013-10-06 | Disposition: A | Discharge status: Home / Self Care | Payer: Self-pay | Attending: Emergency Medicine | Admitting: Emergency Medicine

## 2013-10-06 ENCOUNTER — Encounter (HOSPITAL_COMMUNITY): Payer: Self-pay | Admitting: Emergency Medicine

## 2013-10-06 DIAGNOSIS — G8928 Other chronic postprocedural pain: Secondary | ICD-10-CM | POA: Insufficient documentation

## 2013-10-06 DIAGNOSIS — R109 Unspecified abdominal pain: Secondary | ICD-10-CM | POA: Insufficient documentation

## 2013-10-06 DIAGNOSIS — M79642 Pain in left hand: Secondary | ICD-10-CM

## 2013-10-06 DIAGNOSIS — M25549 Pain in joints of unspecified hand: Secondary | ICD-10-CM | POA: Insufficient documentation

## 2013-10-06 DIAGNOSIS — R110 Nausea: Secondary | ICD-10-CM | POA: Insufficient documentation

## 2013-10-06 DIAGNOSIS — Z87442 Personal history of urinary calculi: Secondary | ICD-10-CM | POA: Insufficient documentation

## 2013-10-06 DIAGNOSIS — R3915 Urgency of urination: Secondary | ICD-10-CM | POA: Insufficient documentation

## 2013-10-06 DIAGNOSIS — Z87828 Personal history of other (healed) physical injury and trauma: Secondary | ICD-10-CM | POA: Insufficient documentation

## 2013-10-06 DIAGNOSIS — Z8719 Personal history of other diseases of the digestive system: Secondary | ICD-10-CM | POA: Insufficient documentation

## 2013-10-06 DIAGNOSIS — R209 Unspecified disturbances of skin sensation: Secondary | ICD-10-CM | POA: Insufficient documentation

## 2013-10-06 DIAGNOSIS — Z981 Arthrodesis status: Secondary | ICD-10-CM | POA: Insufficient documentation

## 2013-10-06 DIAGNOSIS — G8929 Other chronic pain: Secondary | ICD-10-CM

## 2013-10-06 DIAGNOSIS — M542 Cervicalgia: Secondary | ICD-10-CM | POA: Insufficient documentation

## 2013-10-06 HISTORY — DX: Arthrodesis status: Z98.1

## 2013-10-06 HISTORY — DX: Calculus of kidney: N20.0

## 2013-10-06 LAB — CBC WITH DIFFERENTIAL/PLATELET
Basophils Absolute: 0 10*3/uL (ref 0.0–0.1)
Basophils Relative: 1 % (ref 0–1)
EOS PCT: 3 % (ref 0–5)
Eosinophils Absolute: 0.2 10*3/uL (ref 0.0–0.7)
HCT: 39.6 % (ref 39.0–52.0)
Hemoglobin: 13.5 g/dL (ref 13.0–17.0)
LYMPHS ABS: 1.3 10*3/uL (ref 0.7–4.0)
LYMPHS PCT: 21 % (ref 12–46)
MCH: 29.9 pg (ref 26.0–34.0)
MCHC: 34.1 g/dL (ref 30.0–36.0)
MCV: 87.6 fL (ref 78.0–100.0)
Monocytes Absolute: 0.9 10*3/uL (ref 0.1–1.0)
Monocytes Relative: 15 % — ABNORMAL HIGH (ref 3–12)
NEUTROS ABS: 3.7 10*3/uL (ref 1.7–7.7)
NEUTROS PCT: 60 % (ref 43–77)
PLATELETS: 390 10*3/uL (ref 150–400)
RBC: 4.52 MIL/uL (ref 4.22–5.81)
RDW: 14 % (ref 11.5–15.5)
WBC: 6.2 10*3/uL (ref 4.0–10.5)

## 2013-10-06 LAB — URINALYSIS, ROUTINE W REFLEX MICROSCOPIC
Bilirubin Urine: NEGATIVE
Glucose, UA: NEGATIVE mg/dL
HGB URINE DIPSTICK: NEGATIVE
Ketones, ur: NEGATIVE mg/dL
LEUKOCYTES UA: NEGATIVE
Nitrite: NEGATIVE
Protein, ur: NEGATIVE mg/dL
SPECIFIC GRAVITY, URINE: 1.026 (ref 1.005–1.030)
UROBILINOGEN UA: 0.2 mg/dL (ref 0.0–1.0)
pH: 5 (ref 5.0–8.0)

## 2013-10-06 LAB — BASIC METABOLIC PANEL
BUN: 17 mg/dL (ref 6–23)
CHLORIDE: 103 meq/L (ref 96–112)
CO2: 23 meq/L (ref 19–32)
Calcium: 9.1 mg/dL (ref 8.4–10.5)
Creatinine, Ser: 1.12 mg/dL (ref 0.50–1.35)
GFR calc Af Amer: >90 mL/min (ref >90)
GFR calc non Af Amer: 78 mL/min — ABNORMAL LOW (ref >90)
GLUCOSE: 110 mg/dL — AB (ref 70–99)
POTASSIUM: 4 meq/L (ref 3.7–5.3)
SODIUM: 142 meq/L (ref 137–147)

## 2013-10-06 MED ORDER — KETOROLAC TROMETHAMINE 30 MG/ML IJ SOLN
30.0000 mg | Freq: Once | INTRAMUSCULAR | Status: AC
  Administered 2013-10-06: 30 mg via INTRAVENOUS
  Filled 2013-10-06: qty 1

## 2013-10-06 MED ORDER — TRAMADOL HCL 50 MG PO TABS: 50.0000 mg | ORAL_TABLET | Freq: Four times a day (QID) | ORAL | Status: DC | PRN

## 2013-10-06 MED ORDER — ONDANSETRON HCL 4 MG/2ML IJ SOLN
4.0000 mg | Freq: Once | INTRAMUSCULAR | Status: AC
  Administered 2013-10-06: 4 mg via INTRAVENOUS
  Filled 2013-10-06: qty 2

## 2013-10-06 NOTE — Discharge Instructions (Signed)
Take the prescribed medication as directed. Follow-up with pain management for further discussion about spinal injections. May follow up with hand surgery regarding hand pain. Return to the ED for new or worsening symptoms.

## 2013-10-06 NOTE — ED Notes (Signed)
PA at bedside.

## 2013-10-06 NOTE — ED Notes (Signed)
Pt c/o of generalized pain that started to worsen on 10/04/13 with the extreme in cold temperature.  Pt c/o of Left hand pain that shoots up to the mid forearm, neck pain in the C5-C7 from titanium screws/rod, and right abdominal pain with radiation to the testicle.  Pt c/o that he has dribbling during urination, states "I need to go but can't".  Generalized pain of 7/10.
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
## 2013-10-07 ENCOUNTER — Emergency Department (HOSPITAL_COMMUNITY)
Admission: EM | Admit: 2013-10-07 | Discharge: 2013-10-07 | Disposition: A | Discharge status: Home / Self Care | Payer: Self-pay | Attending: Emergency Medicine | Admitting: Emergency Medicine

## 2013-10-07 ENCOUNTER — Encounter (HOSPITAL_COMMUNITY): Payer: Self-pay | Admitting: Emergency Medicine

## 2013-10-07 DIAGNOSIS — Z8739 Personal history of other diseases of the musculoskeletal system and connective tissue: Secondary | ICD-10-CM | POA: Insufficient documentation

## 2013-10-07 DIAGNOSIS — R1032 Left lower quadrant pain: Secondary | ICD-10-CM | POA: Insufficient documentation

## 2013-10-07 DIAGNOSIS — Z87448 Personal history of other diseases of urinary system: Secondary | ICD-10-CM | POA: Insufficient documentation

## 2013-10-07 DIAGNOSIS — R109 Unspecified abdominal pain: Secondary | ICD-10-CM

## 2013-10-07 DIAGNOSIS — M542 Cervicalgia: Secondary | ICD-10-CM | POA: Insufficient documentation

## 2013-10-07 DIAGNOSIS — M79643 Pain in unspecified hand: Secondary | ICD-10-CM

## 2013-10-07 DIAGNOSIS — R112 Nausea with vomiting, unspecified: Secondary | ICD-10-CM | POA: Insufficient documentation

## 2013-10-07 DIAGNOSIS — M549 Dorsalgia, unspecified: Secondary | ICD-10-CM | POA: Insufficient documentation

## 2013-10-07 DIAGNOSIS — R197 Diarrhea, unspecified: Secondary | ICD-10-CM | POA: Insufficient documentation

## 2013-10-07 DIAGNOSIS — G8929 Other chronic pain: Secondary | ICD-10-CM | POA: Insufficient documentation

## 2013-10-07 HISTORY — DX: Other chronic pain: G89.29

## 2013-10-07 LAB — COMPREHENSIVE METABOLIC PANEL
ALK PHOS: 83 U/L (ref 39–117)
ALT: 19 U/L (ref 0–53)
AST: 27 U/L (ref 0–37)
Albumin: 3.5 g/dL (ref 3.5–5.2)
BUN: 21 mg/dL (ref 6–23)
CALCIUM: 8.5 mg/dL (ref 8.4–10.5)
CO2: 21 meq/L (ref 19–32)
Chloride: 107 mEq/L (ref 96–112)
Creatinine, Ser: 1.1 mg/dL (ref 0.50–1.35)
GFR, EST NON AFRICAN AMERICAN: 80 mL/min — AB (ref >90)
GLUCOSE: 93 mg/dL (ref 70–99)
POTASSIUM: 3.9 meq/L (ref 3.7–5.3)
Sodium: 143 mEq/L (ref 137–147)
TOTAL PROTEIN: 6.7 g/dL (ref 6.0–8.3)
Total Bilirubin: 0.3 mg/dL (ref 0.3–1.2)

## 2013-10-07 LAB — CBC WITH DIFFERENTIAL/PLATELET
Basophils Absolute: 0 10*3/uL (ref 0.0–0.1)
Basophils Relative: 0 % (ref 0–1)
EOS ABS: 0.3 10*3/uL (ref 0.0–0.7)
EOS PCT: 4 % (ref 0–5)
HEMATOCRIT: 38.3 % — AB (ref 39.0–52.0)
HEMOGLOBIN: 13.3 g/dL (ref 13.0–17.0)
LYMPHS ABS: 0.9 10*3/uL (ref 0.7–4.0)
LYMPHS PCT: 10 % — AB (ref 12–46)
MCH: 30.6 pg (ref 26.0–34.0)
MCHC: 34.7 g/dL (ref 30.0–36.0)
MCV: 88 fL (ref 78.0–100.0)
MONOS PCT: 14 % — AB (ref 3–12)
Monocytes Absolute: 1.3 10*3/uL — ABNORMAL HIGH (ref 0.1–1.0)
Neutro Abs: 6.7 10*3/uL (ref 1.7–7.7)
Neutrophils Relative %: 72 % (ref 43–77)
Platelets: 359 10*3/uL (ref 150–400)
RBC: 4.35 MIL/uL (ref 4.22–5.81)
RDW: 13.9 % (ref 11.5–15.5)
WBC: 9.3 10*3/uL (ref 4.0–10.5)

## 2013-10-07 LAB — LIPASE, BLOOD: LIPASE: 23 U/L (ref 11–59)

## 2013-10-07 MED ORDER — ONDANSETRON HCL 4 MG/2ML IJ SOLN
4.0000 mg | Freq: Once | INTRAMUSCULAR | Status: AC
  Administered 2013-10-07: 4 mg via INTRAVENOUS
  Filled 2013-10-07: qty 2

## 2013-10-07 MED ORDER — MORPHINE SULFATE 4 MG/ML IJ SOLN
4.0000 mg | Freq: Once | INTRAMUSCULAR | Status: AC
  Administered 2013-10-07: 4 mg via INTRAVENOUS
  Filled 2013-10-07: qty 1

## 2013-10-07 MED ORDER — PROMETHAZINE HCL 25 MG PO TABS: 25.0000 mg | ORAL_TABLET | Freq: Four times a day (QID) | ORAL | Status: DC | PRN

## 2013-10-07 MED ORDER — HYDROCODONE-ACETAMINOPHEN 5-325 MG PO TABS
1.0000 | ORAL_TABLET | Freq: Four times a day (QID) | ORAL | Status: DC | PRN
Start: 2013-10-07 — End: 2013-12-01

## 2013-10-07 MED ORDER — SODIUM CHLORIDE 0.9 % IV BOLUS (SEPSIS)
1000.0000 mL | Freq: Once | INTRAVENOUS | Status: AC
  Administered 2013-10-07: 1000 mL via INTRAVENOUS

## 2013-10-07 NOTE — ED Provider Notes (Signed)
CSN: [id number]     Arrival date & time 10/07/13  0559 History   First MD Initiated Contact with Patient 10/07/13 [phone]     Chief Complaint  Patient presents with  . Emesis   (Consider location/radiation/quality/duration/timing/severity/associated sxs/prior Treatment) HPI Comments: Patient presents today with a chief complaint of nausea, vomiting, and diarrhea. He reports that his symptoms began last evening.  He reports several episodes of both vomiting and diarrhea.  No blood in his emesis or blood in his stool.   He also reports that this morning he began having LUQ abdominal pain.  Pain does not radiate.  He reports that he only has the pain when his abdomen is palpated.  He describes the pain as a "soreness."  No pain when the abdomen is not being touched.  He denies fever or chills.  Denies urinary symptoms.  He has not taken any mediation for the vomiting or diarrhea.  Patient is also complaining of pain in his neck.  Pain is chronic.  He has a history of a Cervical Spinal Fusion.  He was seen in the ED for this yesterday and discharged home with prescription for Tramadol.  He reports that he is taking the Tramadol without relief.  He reports that his pain today is the same pain that he has had in the past.  No acute injury or trauma.  Patient is also complaining of pain to this thumbs bilaterally.  This pain is also chronic.  He was evaluated for this also in the ED yesterday.  He reports that this pain has been present for years.  Pain worsened by cold weather.  Pain worse with ROM of the thumbs.  He denies any erythema, edema, or warmth of the thumbs.  No acute injury or trauma.  The history is provided by the patient.    Past Medical History  Diagnosis Date  . Renal disorder   . Colitis   . Kidney stones   . History of spinal fusion   . Chronic pain    Past Surgical History  Procedure Laterality Date  . Cervical fusion    . Shoulder arthroscopy with rotator cuff repair      Left   . Mandible surgery    . Ankle arthroscopy      Left   Family History  Problem Relation Age of Onset  . COPD Father    History  Substance Use Topics  . Smoking status: Never Smoker   . Smokeless tobacco: Not on file  . Alcohol Use: No    Review of Systems  All other systems reviewed and are negative.    Allergies  Review of patient's allergies indicates no known allergies.  Home Medications   Current Outpatient Rx  Name  Route  Sig  Dispense  Refill  . traMADol (ULTRAM) 50 MG tablet   Oral   Take 1 tablet (50 mg total) by mouth every 6 (six) hours as needed.   15 tablet   0    BP 112/69  Pulse 50  Temp(Src) 98 F (36.7 C) (Oral)  Resp 20  SpO2 100% Physical Exam  Nursing note and vitals reviewed. Constitutional: He appears well-developed and well-nourished.  HENT:  Head: Normocephalic and atraumatic.  Mouth/Throat: Oropharynx is clear and moist.  Neck: Normal range of motion. Neck supple. Spinous process tenderness present.  Cardiovascular: Normal rate, regular rhythm and normal heart sounds.   Pulmonary/Chest: Effort normal and breath sounds normal.  Abdominal: Soft. Normal appearance and bowel  sounds are normal. He exhibits no distension and no mass. There is tenderness in the left upper quadrant. There is no rebound, no guarding and no CVA tenderness.  Mild tenderness to palpation of the LUQ  Musculoskeletal:  Full ROM of thumbs bilaterally Increased pain with ROM of both thumbs No erythema, edema, or warmth of thumbs   Neurological:  Distal sensation of both thumbs is intact  Skin: Skin is warm and dry.  Psychiatric: He has a normal mood and affect.    ED Course  Procedures (including critical care time) Labs Review Labs Reviewed  CBC WITH DIFFERENTIAL  COMPREHENSIVE METABOLIC PANEL   Imaging Review No results found.  EKG Interpretation   None      8:32 AM Reassessed patient.  Patient reports that his pain and nausea have improved.   Patient tolerating PO liquids.  Abdomen soft and nontender. MDM  No diagnosis found. Patient presenting with several complaints.  Patient complaining of chronic pain of his hands and neck.  No acute injury.  No signs of infection.  Patient also presenting with nausea, vomiting, diarrhea, and abdominal pain.  Labs unremarkable.  On exam, mild LUQ abdominal pain.  No rebound or guarding.  Patient afebrile.  Therefore, doubt surgical abdomen.  Pain improved during ED course.  Nausea also improved during ED course.  No vomiting or diarrhea during ED course.  Feel that the patient is stable for discharge. Return precautions given.    Santiago Glad, PA-C 10/07/13 [phone]

## 2013-10-07 NOTE — Discharge Instructions (Signed)
Follow up with your primary care doctor about your hospital visit. Continue to hydrate orally.Take all medications as prescribed & use Phenergan as directed for nausea & vomiting.  Read the instructions below for reasons to return to the ER.  ° °The 'BRAT' diet is suggested, then progress to diet as tolerated as symptoms abate. Call if bloody stools, persistent diarrhea, vomiting, fever or abdominal pain. °Bananas.  °Rice.  °Applesauce.  °Toast (and other simple starches such as crackers, potatoes, noodles).  ° °SEEK IMMEDIATE MEDICAL ATTENTION IF: ° °You begin having localized abdominal pain that does not go away or becomes severe (The right side could  possibly be appendicitis. In an adult, the left lower portion of the abdomen could be colitis or diverticulitis) °  °A temperature above 101 develops ° °Repeated vomiting occurs (multiple uncontrollable episodes) or you are unable to keep fluids down ° °Blood is being passed in stools or vomit (bright red or black tarry stools).  ° °Return also if you develop chest pain, difficulty breathing, dizziness or fainting, or become confused, poorly responsive, or inconsolable (young children). ° ° °RESOURCE GUIDE ° °Dental Problems ° °Patients with Medicaid: °[facility]                     [facility] °[address]                                           [address] °Phone:  [phone]                                                  Phone:  510-2600 ° °If unable to pay or uninsured, contact:  [facility] or [facility] to become qualified for the adult dental clinic. ° °Chronic Pain Problems °Contact [facility]  [phone] °Patients need to be referred by their primary care doctor. ° °Insufficient Money for Medicine °Contact [employer]:  call "211" or [facility] 271-5999. ° °No Primary Care Doctor °Call [facility]  832-8000 °Other agencies that provide inexpensive medical care ° [facility]  [phone] °    Internal Medicine  832-7272 °   [facility]  [phone] °   Women's Clinic  832-4777 °   [facility]  [phone] °   [facility]  272-1050 ° °Psychological Services °[facility]  [phone] °[facility]  [phone] °[facility]   [phone] (emergency services 641-4993) ° °Substance Abuse Resources °[facility]  336-882-2125 °[facility] 336-784-9470 °[facility] 336-285-9073 °[facility] 336-845-3988 °Residential & Outpatient Substance Abuse Program  800-659-3381 ° °Abuse/Neglect °[place] [facility] (336) 641-3795 °[place] [facility] Hotline 800-378-5315 (After Hours) ° °Emergency Shelter °[facility] (336) 271-5985 ° °Maternity Homes °[facility] (336) 275-9566 °[facility] (704) 372-4663 ° °MRSA Hotline #:   832-7006 ° ° ° °[place] [facility] ° °[facility]     [employer]                          [facility] °[address] [place]                       [address]      [address]  °[place]                                                [place]                            [place] °Phone:  349-3220                                   Phone:  [phone]                 Phone:  342-8140 ° °[facility] °Phone:  342-8316 ° °[place] Child Abuse Hotline °(336) 342-1394 °(336) 342-3537 (After Hours) ° ° ° ° °

## 2013-10-07 NOTE — ED Notes (Signed)
Pt c/o of Emesis since 12am this morning. Pt states he also has diarrhea. Emesis and diarrhea has been multiple times. Pt has hx chronic pain. Pt states pain level is at 10

## 2013-10-07 NOTE — ED Notes (Signed)
PA at bedside.

## 2013-10-07 NOTE — ED Notes (Signed)
Pt has multiple complaints. Mostly Chronic Pain

## 2013-10-07 NOTE — ED Provider Notes (Signed)
Medical screening examination/treatment/procedure(s) were performed by non-physician practitioner and as supervising physician I was immediately available for consultation/collaboration.   Dione Boozeavid Ramaj Frangos, MD 10/07/13 2255

## 2013-10-11 NOTE — ED Provider Notes (Signed)
Medical screening examination/treatment/procedure(s) were performed by non-physician practitioner and as supervising physician I was immediately available for consultation/collaboration.  Eleah Lahaie J. Solomia Harrell, MD 10/11/13 1103 

## 2013-12-01 ENCOUNTER — Emergency Department (HOSPITAL_COMMUNITY)
Admission: EM | Admit: 2013-12-01 | Discharge: 2013-12-02 | Disposition: A | Discharge status: Other Acute Inpatient Hospital | Payer: Federal, State, Local not specified - Other | Attending: Emergency Medicine | Admitting: Emergency Medicine

## 2013-12-01 ENCOUNTER — Encounter (HOSPITAL_COMMUNITY): Payer: Self-pay | Admitting: Emergency Medicine

## 2013-12-01 DIAGNOSIS — N289 Disorder of kidney and ureter, unspecified: Secondary | ICD-10-CM | POA: Insufficient documentation

## 2013-12-01 DIAGNOSIS — F411 Generalized anxiety disorder: Secondary | ICD-10-CM | POA: Insufficient documentation

## 2013-12-01 DIAGNOSIS — F1110 Opioid abuse, uncomplicated: Secondary | ICD-10-CM | POA: Insufficient documentation

## 2013-12-01 DIAGNOSIS — G8929 Other chronic pain: Secondary | ICD-10-CM | POA: Insufficient documentation

## 2013-12-01 DIAGNOSIS — K5289 Other specified noninfective gastroenteritis and colitis: Secondary | ICD-10-CM | POA: Insufficient documentation

## 2013-12-01 DIAGNOSIS — Z981 Arthrodesis status: Secondary | ICD-10-CM | POA: Insufficient documentation

## 2013-12-01 DIAGNOSIS — Z0289 Encounter for other administrative examinations: Secondary | ICD-10-CM | POA: Insufficient documentation

## 2013-12-01 DIAGNOSIS — F1510 Other stimulant abuse, uncomplicated: Secondary | ICD-10-CM | POA: Insufficient documentation

## 2013-12-01 DIAGNOSIS — R109 Unspecified abdominal pain: Secondary | ICD-10-CM | POA: Insufficient documentation

## 2013-12-01 DIAGNOSIS — M542 Cervicalgia: Secondary | ICD-10-CM | POA: Insufficient documentation

## 2013-12-01 LAB — CBC
HCT: 37.1 % — ABNORMAL LOW (ref 39.0–52.0)
Hemoglobin: 12 g/dL — ABNORMAL LOW (ref 13.0–17.0)
MCH: 28.9 pg (ref 26.0–34.0)
MCHC: 32.3 g/dL (ref 30.0–36.0)
MCV: 89.4 fL (ref 78.0–100.0)
PLATELETS: 407 10*3/uL — AB (ref 150–400)
RBC: 4.15 MIL/uL — AB (ref 4.22–5.81)
RDW: 14.3 % (ref 11.5–15.5)
WBC: 6 10*3/uL (ref 4.0–10.5)

## 2013-12-01 LAB — COMPREHENSIVE METABOLIC PANEL
ALBUMIN: 4 g/dL (ref 3.5–5.2)
ALT: 18 U/L (ref 0–53)
AST: 17 U/L (ref 0–37)
Alkaline Phosphatase: 80 U/L (ref 39–117)
BUN: 21 mg/dL (ref 6–23)
CALCIUM: 9 mg/dL (ref 8.4–10.5)
CO2: 27 meq/L (ref 19–32)
CREATININE: 1.31 mg/dL (ref 0.50–1.35)
Chloride: 100 mEq/L (ref 96–112)
GFR calc Af Amer: 75 mL/min — ABNORMAL LOW (ref >90)
GFR, EST NON AFRICAN AMERICAN: 65 mL/min — AB (ref >90)
Glucose, Bld: 95 mg/dL (ref 70–99)
Potassium: 4.9 mEq/L (ref 3.7–5.3)
SODIUM: 139 meq/L (ref 137–147)
TOTAL PROTEIN: 7 g/dL (ref 6.0–8.3)
Total Bilirubin: <0.2 mg/dL — ABNORMAL LOW (ref 0.3–1.2)

## 2013-12-01 LAB — ETHANOL: Alcohol, Ethyl (B): <11 mg/dL (ref 0–11)

## 2013-12-01 LAB — SALICYLATE LEVEL

## 2013-12-01 LAB — RAPID URINE DRUG SCREEN, HOSP PERFORMED
AMPHETAMINES: POSITIVE — AB
BENZODIAZEPINES: NOT DETECTED
Barbiturates: NOT DETECTED
COCAINE: NOT DETECTED
OPIATES: POSITIVE — AB
TETRAHYDROCANNABINOL: NOT DETECTED

## 2013-12-01 LAB — ACETAMINOPHEN LEVEL: Acetaminophen (Tylenol), Serum: <15 ug/mL (ref 10–30)

## 2013-12-01 MED ORDER — DIPHENOXYLATE-ATROPINE 2.5-0.025 MG PO TABS: 2.0000 | ORAL_TABLET | Freq: Four times a day (QID) | ORAL | Status: DC | PRN

## 2013-12-01 MED ORDER — DICYCLOMINE HCL 20 MG PO TABS: 20.0000 mg | ORAL_TABLET | Freq: Three times a day (TID) | ORAL | Status: DC | PRN

## 2013-12-01 MED ORDER — CLONIDINE HCL 0.1 MG PO TABS
0.1000 mg | ORAL_TABLET | Freq: Three times a day (TID) | ORAL | Status: DC
  Administered 2013-12-01 – 2013-12-02 (×2): 0.1 mg via ORAL
  Filled 2013-12-01 (×2): qty 1

## 2013-12-01 MED ORDER — IBUPROFEN 800 MG PO TABS
800.0000 mg | ORAL_TABLET | Freq: Three times a day (TID) | ORAL | Status: DC | PRN
  Administered 2013-12-01 – 2013-12-02 (×2): 800 mg via ORAL
  Filled 2013-12-01 (×2): qty 1

## 2013-12-01 NOTE — ED Provider Notes (Signed)
CSN: [id number]     Arrival date & time 12/01/13  1716 History   First MD Initiated Contact with Patient 12/01/13 1935     Chief Complaint  Patient presents with  . Medical Clearance     HPI  Patient presents here with a request to be been detoxified his heroin. 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 He also states he has chronic neck pain previous laminectomy. Chronic shoulder pain. See your Dr. Abram Sanderdivorcee lost his medical insurance. He states he started using heroin off of the street about 5 months ago. He states he uses heroin intravenously about 2-4 times per week. His last dose was noon today. His withdrawal symptoms include abdominal cramping. Nausea no vomiting. Anxiety.  Past Medical History  Diagnosis Date  . Renal disorder   . Colitis   . Kidney stones   . History of spinal fusion   . Chronic pain    Past Surgical History  Procedure Laterality Date  . Cervical fusion    . Shoulder arthroscopy with rotator cuff repair      Left  . Mandible surgery    . Ankle arthroscopy      Left   Family History  Problem Relation Age of Onset  . COPD Father    History  Substance Use Topics  . Smoking status: Never Smoker   . Smokeless tobacco: Not on file  . Alcohol Use: No    Review of Systems  Constitutional: Negative for fever, chills, diaphoresis, appetite change and fatigue.  HENT: Negative for mouth sores, sore throat and trouble swallowing.   Eyes: Negative for visual disturbance.  Respiratory: Negative for cough, chest tightness, shortness of breath and wheezing.   Cardiovascular: Negative for chest pain.  Gastrointestinal: Negative for nausea, vomiting, abdominal pain, diarrhea and abdominal distention.       Cramping  Endocrine: Negative for polydipsia, polyphagia and polyuria.  Genitourinary: Negative for dysuria, frequency and hematuria.  Musculoskeletal: Negative for gait problem.  Skin: Negative for color change, pallor and rash.   Neurological: Negative for dizziness, syncope, light-headedness and headaches.  Hematological: Does not bruise/bleed easily.  Psychiatric/Behavioral: Negative for behavioral problems and confusion.       Anxiety      Allergies  Review of patient's allergies indicates no known allergies.  Home Medications  No current outpatient prescriptions on file. BP 134/81  Pulse 101  Temp(Src) 98.4 F (36.9 C) (Oral)  Resp 18  SpO2 97% Physical Exam  Constitutional: He is oriented to person, place, and time. He appears well-developed and well-nourished. No distress.  HENT:  Head: Normocephalic.  Eyes: Conjunctivae are normal. Pupils are equal, round, and reactive to light. No scleral icterus.  Neck: Normal range of motion. Neck supple. No thyromegaly present.  Cardiovascular: Normal rate and regular rhythm.  Exam reveals no gallop and no friction rub.   No murmur heard. Pulmonary/Chest: Effort normal and breath sounds normal. No respiratory distress. He has no wheezes. He has no rales.  Abdominal: Soft. Bowel sounds are normal. He exhibits no distension. There is no tenderness. There is no rebound.  Musculoskeletal: Normal range of motion.  Neurological: He is alert and oriented to person, place, and time.  Skin: Skin is warm and dry. No rash noted.  Psychiatric: His behavior is normal. His mood appears anxious.  Anxious. Normal judgment and insight.    ED Course  Procedures (including critical care time) Labs Review Labs Reviewed  CBC - Abnormal;  Notable for the following:    RBC 4.15 (*)    Hemoglobin 12.0 (*)    HCT 37.1 (*)    Platelets 407 (*)    All other components within normal limits  COMPREHENSIVE METABOLIC PANEL - Abnormal; Notable for the following:    Total Bilirubin <0.2 (*)    GFR calc non Af Amer 65 (*)    GFR calc Af Amer 75 (*)    All other components within normal limits  SALICYLATE LEVEL - Abnormal; Notable for the following:    Salicylate Lvl <2.0 (*)     All other components within normal limits  URINE RAPID DRUG SCREEN (HOSP PERFORMED) - Abnormal; Notable for the following:    Opiates POSITIVE (*)    Amphetamines POSITIVE (*)    All other components within normal limits  ACETAMINOPHEN LEVEL  ETHANOL   Imaging Review No results found.   EKG Interpretation None      MDM   Final diagnoses:  Opiate abuse, continuous  Amphetamine abuse  Chronic pain     Patient medicated with Lomotil, Bentyl, and clonidine. It is a 6.0 hemoglobin 12 positive for opiates amphetamines in his urine. Normal renal function. Negative alcohol. Negative salicylate acetaminophen  He is awaiting act team evaluation for his options.  Rolland Porter, MD 12/01/13 2218

## 2013-12-01 NOTE — ED Notes (Signed)
Per pt, wants detox off opiates.  Has been taking pain meds since 2007 with back surgery.

## 2013-12-01 NOTE — ED Notes (Signed)
Pt says he is here for detox, takes pain pills for chronic pain.

## 2013-12-02 ENCOUNTER — Inpatient Hospital Stay (HOSPITAL_COMMUNITY)
Admission: AD | Admit: 2013-12-02 | Discharge: 2013-12-07 | DRG: 897 | Disposition: A | Discharge status: Home / Self Care | Payer: Federal, State, Local not specified - Other | Source: Intra-hospital | Attending: Psychiatry | Admitting: Psychiatry

## 2013-12-02 ENCOUNTER — Encounter (HOSPITAL_COMMUNITY): Payer: Self-pay | Admitting: *Deleted

## 2013-12-02 DIAGNOSIS — F1994 Other psychoactive substance use, unspecified with psychoactive substance-induced mood disorder: Secondary | ICD-10-CM | POA: Diagnosis present

## 2013-12-02 DIAGNOSIS — F332 Major depressive disorder, recurrent severe without psychotic features: Secondary | ICD-10-CM | POA: Diagnosis present

## 2013-12-02 DIAGNOSIS — G4700 Insomnia, unspecified: Secondary | ICD-10-CM | POA: Diagnosis present

## 2013-12-02 DIAGNOSIS — F909 Attention-deficit hyperactivity disorder, unspecified type: Secondary | ICD-10-CM | POA: Diagnosis present

## 2013-12-02 DIAGNOSIS — F39 Unspecified mood [affective] disorder: Secondary | ICD-10-CM

## 2013-12-02 DIAGNOSIS — G8929 Other chronic pain: Secondary | ICD-10-CM | POA: Diagnosis present

## 2013-12-02 DIAGNOSIS — Z981 Arthrodesis status: Secondary | ICD-10-CM

## 2013-12-02 DIAGNOSIS — Z87820 Personal history of traumatic brain injury: Secondary | ICD-10-CM

## 2013-12-02 DIAGNOSIS — F1120 Opioid dependence, uncomplicated: Principal | ICD-10-CM | POA: Diagnosis present

## 2013-12-02 DIAGNOSIS — F411 Generalized anxiety disorder: Secondary | ICD-10-CM | POA: Diagnosis present

## 2013-12-02 DIAGNOSIS — F4310 Post-traumatic stress disorder, unspecified: Secondary | ICD-10-CM | POA: Diagnosis present

## 2013-12-02 MED ORDER — TRAZODONE HCL 50 MG PO TABS
50.0000 mg | ORAL_TABLET | Freq: Every evening | ORAL | Status: DC | PRN
  Administered 2013-12-02 – 2013-12-06 (×5): 50 mg via ORAL
  Filled 2013-12-02: qty 14
  Filled 2013-12-02 (×4): qty 1

## 2013-12-02 MED ORDER — CLONIDINE HCL 0.1 MG PO TABS
0.1000 mg | ORAL_TABLET | Freq: Every day | ORAL | Status: DC
  Administered 2013-12-07: 0.1 mg via ORAL
  Filled 2013-12-02 (×2): qty 1

## 2013-12-02 MED ORDER — CLONIDINE HCL 0.1 MG PO TABS
0.1000 mg | ORAL_TABLET | ORAL | Status: AC
  Administered 2013-12-04 – 2013-12-06 (×3): 0.1 mg via ORAL
  Filled 2013-12-02 (×4): qty 1

## 2013-12-02 MED ORDER — ACETAMINOPHEN 325 MG PO TABS: 650.0000 mg | ORAL_TABLET | Freq: Four times a day (QID) | ORAL | Status: DC | PRN

## 2013-12-02 MED ORDER — ONDANSETRON 4 MG PO TBDP: 4.0000 mg | ORAL_TABLET | Freq: Four times a day (QID) | ORAL | Status: AC | PRN

## 2013-12-02 MED ORDER — HYDROXYZINE HCL 25 MG PO TABS
25.0000 mg | ORAL_TABLET | Freq: Four times a day (QID) | ORAL | Status: AC | PRN
  Administered 2013-12-02 – 2013-12-07 (×7): 25 mg via ORAL
  Filled 2013-12-02 (×7): qty 1

## 2013-12-02 MED ORDER — NAPROXEN 500 MG PO TABS
500.0000 mg | ORAL_TABLET | Freq: Two times a day (BID) | ORAL | Status: AC | PRN
  Administered 2013-12-03 – 2013-12-05 (×5): 500 mg via ORAL
  Filled 2013-12-02 (×5): qty 1

## 2013-12-02 MED ORDER — ALUM & MAG HYDROXIDE-SIMETH 200-200-20 MG/5ML PO SUSP
30.0000 mL | ORAL | Status: DC | PRN
Start: 2013-12-02 — End: 2013-12-02

## 2013-12-02 MED ORDER — ALUM & MAG HYDROXIDE-SIMETH 200-200-20 MG/5ML PO SUSP: 30.0000 mL | ORAL | Status: DC | PRN

## 2013-12-02 MED ORDER — HYDROXYZINE HCL 25 MG PO TABS
50.0000 mg | ORAL_TABLET | Freq: Once | ORAL | Status: AC
  Administered 2013-12-02: 50 mg via ORAL
  Filled 2013-12-02: qty 2

## 2013-12-02 MED ORDER — MAGNESIUM HYDROXIDE 400 MG/5ML PO SUSP: 30.0000 mL | Freq: Every day | ORAL | Status: DC | PRN

## 2013-12-02 MED ORDER — CLONIDINE HCL 0.1 MG PO TABS
0.1000 mg | ORAL_TABLET | Freq: Four times a day (QID) | ORAL | Status: AC
  Administered 2013-12-02 – 2013-12-04 (×7): 0.1 mg via ORAL
  Filled 2013-12-02 (×12): qty 1

## 2013-12-02 MED ORDER — HYDROXYZINE HCL 25 MG PO TABS: 25.0000 mg | ORAL_TABLET | Freq: Three times a day (TID) | ORAL | Status: DC | PRN

## 2013-12-02 MED ORDER — ACETAMINOPHEN 325 MG PO TABS
650.0000 mg | ORAL_TABLET | Freq: Four times a day (QID) | ORAL | Status: DC | PRN
  Administered 2013-12-02: 650 mg via ORAL
  Filled 2013-12-02: qty 2

## 2013-12-02 MED ORDER — METHOCARBAMOL 500 MG PO TABS
500.0000 mg | ORAL_TABLET | Freq: Three times a day (TID) | ORAL | Status: AC | PRN
  Administered 2013-12-02 – 2013-12-06 (×9): 500 mg via ORAL
  Filled 2013-12-02 (×9): qty 1

## 2013-12-02 MED ORDER — DICYCLOMINE HCL 20 MG PO TABS
20.0000 mg | ORAL_TABLET | Freq: Four times a day (QID) | ORAL | Status: AC | PRN
  Administered 2013-12-02: 20 mg via ORAL
  Filled 2013-12-02 (×2): qty 1

## 2013-12-02 MED ORDER — KETOROLAC TROMETHAMINE 60 MG/2ML IM SOLN: 60.0000 mg | Freq: Once | INTRAMUSCULAR | Status: DC

## 2013-12-02 MED ORDER — LOPERAMIDE HCL 2 MG PO CAPS
2.0000 mg | ORAL_CAPSULE | ORAL | Status: AC | PRN
  Administered 2013-12-07: 2 mg via ORAL
  Filled 2013-12-02: qty 1

## 2013-12-02 MED ORDER — METHOCARBAMOL 500 MG PO TABS
500.0000 mg | ORAL_TABLET | Freq: Once | ORAL | Status: AC
  Administered 2013-12-02: 500 mg via ORAL
  Filled 2013-12-02: qty 1
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
## 2013-12-02 NOTE — Tx Team (Signed)
Initial Interdisciplinary Treatment Plan  PATIENT STRENGTHS: (choose at least two) Ability for insight Active sense of humor Average or above average intelligence Capable of independent living Communication skills Financial means General fund of knowledge  PATIENT STRESSORS: Educational concerns Financial difficulties Health problems Marital or family conflict Substance abuse   PROBLEM LIST: Problem List/Patient Goals Date to be addressed Date deferred Reason deferred Estimated date of resolution  Depression      Substance Abuse 2' Depression      Opiate Withdrawal                                           DISCHARGE CRITERIA:  Ability to meet basic life and health needs Adequate post-discharge living arrangements Improved stabilization in mood, thinking, and/or behavior Medical problems require only outpatient monitoring Motivation to continue treatment in a less acute level of care  PRELIMINARY DISCHARGE PLAN: Attend aftercare/continuing care group Attend PHP/IOP Attend 12-step recovery group Outpatient therapy Participate in family therapy  PATIENT/FAMIILY INVOLVEMENT: This treatment plan has been presented to and reviewed with the patient, Riki RuskDavid E XXXDuncan, and/or family member,   The patient and family have been given the opportunity to ask questions and make suggestions.  Rich BraveDuke, Tatem Holsonback Lynn 12/02/2013, 5:07 PM

## 2013-12-02 NOTE — ED Notes (Signed)
Consult in progress at bedside.

## 2013-12-02 NOTE — ED Notes (Signed)
[facility] and with [facility], belongings sent with pt

## 2013-12-02 NOTE — ED Notes (Addendum)
Pt. In new blue scrubs. Pt. Searched and wanded by security. Pt. Has 1 belongings bag. Pt has 1 black hat, 1 magazine, 1 pen, 1 gray t-shirt, one green boxers, pair white socks, black tennis shoes, pants, wallet(coins in pocket). Pt. Belongings locked up in the blue zone at the nurses station.

## 2013-12-02 NOTE — BH Assessment (Signed)
[facility] Assessment Progress Note  Update:  Pt accepted by Dr. Gilmore LarocheAkhtar to Dr. Dub MikesLugo to bed [facility].  Completed support paperwork and faxed to [facility].  Updated pt's nurse who will call report to [facility] and [facility].  Casimer LaniusKristen Ryler Laskowski, MS, [facility] Licensed Professional Counselor Triage Specialist

## 2013-12-02 NOTE — ED Notes (Signed)
Patient has agreed to remain on the unit at present. Denies SI, HI, AVH. Resting quietly.  Patient safety maintained, Q 15 checks continue.

## 2013-12-02 NOTE — Consult Note (Signed)
Subjective: Admitted for opiate detox. Has been using heroine in the last few months. Prior to that was on pain pills. Says started after she had spinal fusion surgery. See Assessment note.   Psychiatric Specialty Exam: Physical Exam  ROS depression, no other Positive ROS  Blood pressure 117/76, pulse 94, temperature 98.3 F (36.8 C), temperature source Oral, resp. rate 18, SpO2 97.00%.There is no weight on file to calculate BMI.  General Appearance: Casual  Eye Contact::  Fair  Speech:  Slow  Volume:  Decreased  Mood:  Dysphoric  Affect:  Constricted  Thought Process:  Goal Directed  Orientation:  Full (Time, Place, and Person)  Thought Content:  Rumination  Suicidal Thoughts:  No  Homicidal Thoughts:  No  Memory:  Recent;   Fair  Judgement:  Poor  Insight:  Shallow  Psychomotor Activity:  Decreased  Concentration:  Fair  Recall:  Fair  Akathisia:  Negative  Handed:  Right  AIMS (if indicated):     Assets:  Desire for Improvement Leisure Time  Sleep:      A: Opiate Dependence. Mood disorder unspecified.  Plan: Started on Clonidine. Waiting for bed placement for Opiate Detox and stabilization.

## 2013-12-02 NOTE — Progress Notes (Signed)
Pt's referral has been faxed to the following detox facilities:  [facility]- per Janus MolderAdolf beds available [facility]- per Will beds available  [facility]- per [facility] currently at capacity    [facility]

## 2013-12-02 NOTE — ED Notes (Signed)
[facility] called for transport. 

## 2013-12-02 NOTE — ED Notes (Signed)
Patient is upset with his room transfer. Increased anxiety. States "They should have left me where I was". Patient is uncomfortable in SAPPU and is bothered that the light doesn't turn off.   Encouragement offered. Reminded pt that he has a bed.   Patient safety maintained, Q 15 checks in place.

## 2013-12-02 NOTE — Progress Notes (Signed)
[facility] provided pt with a [employer] Orange Card application, highlighting [facility], to help patient establish primary care.  °

## 2013-12-02 NOTE — [facility] Group Notes (Signed)
Adult Psychoeducational Group Note  Date:  12/02/2013 Time:  10:58 PM  Group Topic/Focus:  AA Meeting  Participation Level:  Active  Participation Quality:  Appropriate  Affect:  Appropriate  Cognitive:  Appropriate  Insight: Appropriate  Engagement in Group:  Engaged  Modes of Intervention:  Discussion and Education  Additional Comments:  Dale Contreras was very open about what he has gone through.  He was engaged and active during group.  Dale Contreras, Dale Contreras 12/02/2013, 10:58 PM

## 2013-12-03 DIAGNOSIS — F332 Major depressive disorder, recurrent severe without psychotic features: Secondary | ICD-10-CM

## 2013-12-03 DIAGNOSIS — F1910 Other psychoactive substance abuse, uncomplicated: Secondary | ICD-10-CM

## 2013-12-03 DIAGNOSIS — F411 Generalized anxiety disorder: Secondary | ICD-10-CM

## 2013-12-03 MED ORDER — PREGABALIN 100 MG PO CAPS
200.0000 mg | ORAL_CAPSULE | Freq: Two times a day (BID) | ORAL | Status: DC
  Administered 2013-12-03 – 2013-12-07 (×8): 200 mg via ORAL
  Filled 2013-12-03 (×8): qty 2

## 2013-12-03 NOTE — Progress Notes (Signed)
Adult Psychoeducational Group Note  Date:  12/03/2013 Time:  4:47 PM  Group Topic/Focus:  Healthy Communication:   The focus of this group is to discuss communication, barriers to communication, as well as healthy ways to communicate with others.  Participation Level:  Active  Participation Quality:  Appropriate and Attentive  Affect:  Appropriate  Cognitive:  Appropriate  Insight: Appropriate  Engagement in Group:  Engaged  Modes of Intervention:  Activity, Discussion and Socialization  Elijio MilesMercer, Jezel Basto N 12/03/2013, 4:47 PM

## 2013-12-03 NOTE — [facility] Group Notes (Signed)
[facility] Group Notes: (Clinical Social Work)   12/03/2013      Type of Therapy:  Group Therapy   Participation Level:  Did Not Attend    Ambrose MantleMareida Grossman-Orr, LCSW 12/03/2013, 12:27 PM

## 2013-12-03 NOTE — [facility] Suicide Risk Assessment (Signed)
Suicide Risk Assessment  Admission Assessment     Nursing information obtained from:  Patient Demographic factors:  Male;Divorced or widowed;Caucasian;Low socioeconomic status;Living alone;Unemployed Current Mental Status:  NA Loss Factors:  Decrease in vocational status;Loss of significant relationship;Decline in physical health Historical Factors:  Impulsivity;NA Risk Reduction Factors:  Sense of responsibility to family;Positive social support;Positive coping skills or problem solving skills Total Time spent with patient: 1 hour  CLINICAL FACTORS:   Dysthymia Chronic Pain Unstable or Poor Therapeutic Relationship Previous Psychiatric Diagnoses and Treatments Medical Diagnoses and Treatments/Surgeries  Psychiatric Specialty Exam:     Blood pressure 119/78, pulse 88, temperature 97.5 F (36.4 C), temperature source Oral, resp. rate 17, height 6' 8.5" (2.045 m), weight 66.679 kg (147 lb), SpO2 100.00%.Body mass index is 15.94 kg/(m^2).  General Appearance: Casual and Guarded  Eye Contact::  Fair  Speech:  Slow  Volume:  Decreased  Mood:  Dysphoric  Affect:  Congruent  Thought Process:  Linear  Orientation:  Full (Time, Place, and Person)  Thought Content:  Rumination  Suicidal Thoughts:  No  Homicidal Thoughts:  No  Memory:  Recent;   Fair  Judgement:  Impaired  Insight:  Lacking  Psychomotor Activity:  Decreased  Concentration:  Fair  Recall:  [employer]  Fund of Knowledge:Fair  Language: Fair  Akathisia:  Negative  Handed:  Right  AIMS (if indicated):     Assets:  Desire for Improvement Resilience  Sleep:  Number of Hours: 6.75   Musculoskeletal: Strength & Muscle Tone: within normal limits Gait & Station: normal Patient leans: N/A  COGNITIVE FEATURES THAT CONTRIBUTE TO RISK:  Closed-mindedness Polarized thinking    SUICIDE RISK:   Mild:  Suicidal ideation of limited frequency, intensity, duration, and specificity.  There are no identifiable plans, no  associated intent, mild dysphoria and related symptoms, good self-control (both objective and subjective assessment), few other risk factors, and identifiable protective factors, including available and accessible social support.  PLAN OF CARE:  I certify that inpatient services furnished can reasonably be expected to improve the patient's condition.  Dale Contreras, Dale Lessig MD 12/03/2013, 9:31 AM

## 2013-12-03 NOTE — Progress Notes (Signed)
 .  Psychoeducational Group Note    Date: 12/03/2013 Time:  0930  Goal Setting Purpose of Group: To be able to set Contreras goal that is measurable and that can be accomplished in one day Participation Level: Did not attend  Dale Contreras 

## 2013-12-03 NOTE — Progress Notes (Signed)
D) Pt has a hard time today detoxing. Has requested several prn's to help him with his physical discomfort. Denies SI and HI rating his depression and hopelessness both at a 5. Pleased and mentioned it several times today "I am glad that my x wife cannot find out where I am".  A) Pt given support and reassurance. Provided with his prn's and assessments made. Encouragement given. R) Denies SI and HI.

## 2013-12-03 NOTE — Progress Notes (Signed)
Patient ID: Dale Contreras, male   DOB: 07/03/1971, 43 y.o.   MRN: [id number] D: Patient mood and affect appeared depressed and flat. Pt report feeling of anxiety and symptoms of withdrawals. Pt denies SI/HI/AVH and pain. Pt attended evening AA group and engage in discussion. Pt denies any needs or concerns.  Cooperative with assessment. No acute distressed noted at this time.   A: Met with pt 1:1. Medications administered as prescribed. Writer encouraged pt to discuss feelings. Pt encouraged to come to staff with any question or concerns.   R: Patient remains safe. He is complaint with medications and denies any adverse reaction. Continue current POC.

## 2013-12-03 NOTE — Progress Notes (Signed)
[facility] Group Notes:  (Nursing/MHT/Case Management/Adjunct)  Date:  12/03/2013  Time:  2100 Type of Therapy:  wrap up group  Participation Level:  Active  Participation Quality:  Appropriate, Attentive and Sharing  Affect:  Appropriate  Cognitive:  Appropriate  Insight:  Appropriate  Engagement in Group:  Engaged  Modes of Intervention:  Clarification, Education and Support  Summary of Progress/Problems: [facility] not [facility]. Pt request a fax be sent either to his attorney or to the courts because of a missed court date.   Shelah LewandowskySquires, Shanel Prazak Carol 12/03/2013, 10:12 PM
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
## 2013-12-04 DIAGNOSIS — F909 Attention-deficit hyperactivity disorder, unspecified type: Secondary | ICD-10-CM

## 2013-12-04 MED ORDER — VENLAFAXINE HCL ER 75 MG PO CP24
75.0000 mg | ORAL_CAPSULE | Freq: Every day | ORAL | Status: DC
  Administered 2013-12-04 – 2013-12-07 (×4): 75 mg via ORAL
  Filled 2013-12-04 (×8): qty 1

## 2013-12-04 NOTE — [facility] Group Notes (Signed)
[facility] Group Notes: (Clinical Social Work)   12/04/2013 10:00-11:00AM   Summary of Progress/Problems: The main focus of today's process group was to identify the patient's current support system and decide on other supports that can be put in place. The picture on workbook was used to discuss why additional supports are needed. An emphasis was placed on using counselor, doctor, therapy groups, 12-step groups, and problem-specific support groups to expand supports. There was also an extensive discussion about what constitutes a healthy support versus an unhealthy support. The patient expressed full comprehension of the concepts presented, and agreed that there is a need to add more supports. The patient stated the current supports in place are only his 45yo daughter and his ex-wife, to a limited extent.  The only friends he has are the ones he uses with.  The group suggested AA/NA and a sponsor, as well as professional supports, to which he responded, "I don't know."  Type of Therapy: Process Group with Motivational Interviewing   Participation Level: Active   Participation Quality: Appropriate, Attentive   Affect: Depressed, Anxious  Cognitive: Appropriate   Insight: Developing/Improving   Engagement in Therapy: Engaged   Modes of Intervention: Education, Teacher, English as a foreign languageupport and Processing, Activity   Pilgrim's PrideMareida Grossman-Orr, LCSW 12:53 PM

## 2013-12-04 NOTE — Progress Notes (Signed)
Adult Psychoeducational Group Note  Date:  12/04/2013 Time:  3:59 PM  Group Topic/Focus:  Healthy Coping Skills  Participation Level:  Active  Participation Quality:  Appropriate and Attentive  Affect:  Appropriate  Cognitive:  Appropriate  Insight: Appropriate  Engagement in Group:  Engaged  Modes of Intervention:  Activity, Discussion and Socialization   Elijio MilesMercer, Jeanpierre Thebeau N 12/04/2013, 3:59 PM

## 2013-12-04 NOTE — Progress Notes (Signed)
Psychoeducational Group Note  Date: 12/04/2013 Time:  0900  Group Topic/Focus:  Gratefulness:  The focus of this group is to help patients identify what two things they are most grateful for in their lives. What helps ground them and to center them on their work to their recovery.  Participation Level:  Active  Participation Quality:  Appropriate  Affect:  Appropriate  Cognitive:  Oriented  Insight:  Improving  Engagement in Group:  Engaged  Additional Comments:    Tirrell Buchberger A  

## 2013-12-04 NOTE — Progress Notes (Signed)
Psychoeducational Group Note  Date:  12/04/2013 Time:  1315 Group Topic/Focus:  Making Healthy Choices:   The focus of this group is to help patients identify negative/unhealthy choices they were using prior to admission and identify positive/healthier coping strategies to replace them upon discharge.  Participation Level:  Active  Participation Quality:  Appropriate  Affect:  Appropriate  Cognitive:  Oriented  Insight:  Improving  Engagement in Group:  Engaged  Additional Comments:    Antiono Ettinger A 12/04/2013 

## 2013-12-04 NOTE — Progress Notes (Signed)
D) Pt has been attending the groups and interacting with his peers appropriately. Pt states "I am not really sure that I want to get clean. I like drinking. I just have everyone on my back, so I am here to get everyone off my back. If I had to make a choice, I would drink". Is working part of the program, but states "I don't feel very invested in stopping". A) Given support and encouragement to think about the possibility of dealing with his addictions and to think about the real reason why he drinks, rather than "I like to drink". R) Denies SI and HI.

## 2013-12-04 NOTE — Progress Notes (Signed)
Late entry on 12-04-2013 for 12-03-2013   Psychoeducational Group Note  Date: 12/04/2013 Time:  1015  Group Topic/Focus:  Identifying Needs:   The focus of this group is to help patients identify their personal needs that have been historically problematic and identify healthy behaviors to address their needs.  Participation Level:  Active  Participation Quality:  Appropriate  Affect:  Appropriate  Cognitive:  Oriented  Insight:  Improving  Engagement in Group:  Engaged  Additional Comments:  Participated fully in the group  Kirbie Stodghill A 

## 2013-12-04 NOTE — Progress Notes (Signed)
Pt pleasant and appropriate in milieu this evening. Denying any withdrawal and refuses clonidine. COWS is a "1" and VSS. Somewhat guarded during 1:1 with this Clinical research associatewriter. Does state he has pain at present of 5/10 from his spinal fusion but does not request anything for pain. Medicated with trazadone prn per his request and on reassess, pt is asleep. Denies SI/HI/AVH and remains safe. Dale Contreras, Dale Contreras

## 2013-12-04 NOTE — Progress Notes (Signed)
[facility] MD Progress Note  12/04/2013 1:58 PM Dale Contreras  MRN:  [id number] Subjective:  Dale Contreras is a 43 year old male who presents for Detox. Pt is having a hard time with detox, he is thinking about using and thinking about it a lot. He reports being squirmish, anxious, and becoming agitated. Denies use of prn medication at this time. Pt admits to being concerned about continuing to use his medications after he his discharged because he doesn't have any insurance. He states his overall mood is sad and down, because he is not using, he is not happy about where he is in life, not working, and feels worthless. He plans on taking his money this time and going to see a doctor, instead of using it to but drugs, he does well while on the medication. He currently rates his depression 6/10, and anxiety 10/10.Denies SI/HI/AVH.  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) Depressive Disorders:  Major Depressive Disorder - Severe (296.23) Total Time spent with patient: 1 hour  Axis I: ADHD, hyperactive type, Major Depression, Recurrent severe and Substance Abuse Axis II: Deferred Axis IV: economic problems, educational problems, problems related to social environment, problems with access to health care services and problems with primary support group Axis V: 41-50 serious symptoms  ADL's:  Intact  Sleep: Fair  Appetite:  Fair  Suicidal Ideation:  Plan:  Denies Intent:  Denies Means:  Denies Homicidal Ideation:  Plan:  Denies Intent:  Denies Means:  Denies AEB (as evidenced by):  Psychiatric Specialty Exam: Physical Exam  ROS  Blood pressure 108/71, pulse 92, temperature 97.4 F (36.3 C), temperature source Oral, resp. rate 16, height 6' 8.5" (2.045 m), weight 66.679 kg (147 lb), SpO2 100.00%.Body mass index is 15.94 kg/(m^2).   General Appearance: Casual  Eye Contact::  Good  Speech:  Clear and Coherent and Normal Rate  Volume:  Normal  Mood:  Anxious, Depressed, Hopeless and Worthless  Affect:  Depressed and Flat  Thought Process:  Goal Directed and Intact  Orientation:  Full (Time, Place, and Person)  Thought Content:  WDL  Suicidal Thoughts:  No  Homicidal Thoughts:  No  Memory:  Immediate;   Good Recent;   Good Remote;   Good  Judgement:  Fair  Insight:  Fair  Psychomotor Activity:  Restlessness and jittery  Concentration:  Good  Recall:  Good  Fund of Knowledge:Fair  Language: Good  Akathisia:  NA  Handed:  Right  AIMS (if indicated):     Assets:  Communication Skills Desire for Improvement Physical Health Social Support Talents/Skills Vocational/Educational  Sleep:  Number of Hours: 5   Musculoskeletal: Strength & Muscle Tone: within normal limits Gait & Station: normal Patient leans: Left and N/A  Current Medications: Current Facility-Administered Medications  Medication Dose Route Frequency Provider Last Rate Last Dose  . acetaminophen (TYLENOL) tablet 650 mg  650 mg Oral Q6H PRN Kristeen Mans, NP   650 mg at 12/02/13 1629  . alum & mag hydroxide-simeth (MAALOX/MYLANTA) 200-200-20 MG/5ML suspension 30 mL  30 mL Oral Q4H PRN Nanine Means, NP      . cloNIDine (CATAPRES) tablet 0.1 mg  0.1 mg Oral QID Kristeen Mans, NP   0.1 mg at 12/04/13 0846   Followed by  . [START ON 12/05/2013] cloNIDine (CATAPRES) tablet 0.1 mg  0.1 mg Oral BH-qamhs Kristeen Mans,  NP   0.1 mg at 12/04/13 1255   Followed by  . [START ON 12/07/2013] cloNIDine (CATAPRES) tablet 0.1 mg  0.1 mg Oral QAC breakfast Kristeen MansFran E Hobson, NP      . dicyclomine (BENTYL) tablet 20 mg  20 mg Oral Q6H PRN Kristeen MansFran E Hobson, NP   20 mg at 12/02/13 1629  . hydrOXYzine (ATARAX/VISTARIL) tablet 25 mg  25 mg Oral Q6H PRN Kristeen MansFran E Hobson, NP   25 mg at 12/02/13 1629  . loperamide (IMODIUM) capsule 2-4 mg  2-4 mg Oral PRN Kristeen MansFran E Hobson, NP      .  magnesium hydroxide (MILK OF MAGNESIA) suspension 30 mL  30 mL Oral Daily PRN Kristeen MansFran E Hobson, NP      . methocarbamol (ROBAXIN) tablet 500 mg  500 mg Oral Q8H PRN Kristeen MansFran E Hobson, NP   500 mg at 12/04/13 0849  . naproxen (NAPROSYN) tablet 500 mg  500 mg Oral BID PRN Kristeen MansFran E Hobson, NP   500 mg at 12/04/13 0849  . ondansetron (ZOFRAN-ODT) disintegrating tablet 4 mg  4 mg Oral Q6H PRN Kristeen MansFran E Hobson, NP      . pregabalin (LYRICA) capsule 200 mg  200 mg Oral BID Truman Haywardakia S Starkes, FNP   200 mg at 12/04/13 0847  . traZODone (DESYREL) tablet 50 mg  50 mg Oral QHS PRN Kristeen MansFran E Hobson, NP   50 mg at 12/03/13 2101    Lab Results: No results found for this or any previous visit (from the past 48 hour(s)).  Physical Findings: AIMS: Facial and Oral Movements Muscles of Facial Expression: None, normal Lips and Perioral Area: None, normal Jaw: None, normal Tongue: None, normal,Extremity Movements Upper (arms, wrists, hands, fingers): None, normal Lower (legs, knees, ankles, toes): None, normal, Trunk Movements Neck, shoulders, hips: None, normal, Overall Severity Severity of abnormal movements (highest score from questions above): None, normal Incapacitation due to abnormal movements: None, normal Patient's awareness of abnormal movements (rate only patient's report): No Awareness, Dental Status Current problems with teeth and/or dentures?: No Does patient usually wear dentures?: No  CIWA:  CIWA-Ar Total: 2 COWS:  COWS Total Score: 1  Treatment Plan Summary: Daily contact with patient to assess and evaluate symptoms and progress in treatment Medication management  Plan:1 Admit for crisis management and stabilization.  2. Medication management to reduce symptoms to baseline and improved the patient's overall level of functioning. Closely monitor the side effects, efficacy and therapeutic response of medication.  3. Treat health problem as indicated.  4. Developed treatment plan to decrease the risk of  relapse upon discharge and to reduce the need for readmission.  5. Psychosocial education regarding relapse prevention in self-care.  6. Healthcare followup as needed for medical problems and called consults as indicated.  7. Increase collateral information.  8. Restart home medication where appropriate  9. Encouraged to participate and verbalize into group milieu therapy.  10 Effexor XR 75mg  1 tablet po daily.   Medical Decision Making Problem Points:  Established problem, worsening (2), Review of last therapy session (1) and Review of psycho-social stressors (1) Data Points:  Review or order clinical lab tests (1) Review and summation of old records (2) Review of medication regiment & side effects (2) Review of new medications or change in dosage (2)  I certify that inpatient services furnished can reasonably be expected to improve the patient's condition.   Malachy ChamberSTARKES, Shyler Hamill S FNP-BC 12/04/2013, 1:58 PM
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
## 2013-12-05 MED ORDER — BUTALBITAL-APAP-CAFFEINE 50-325-40 MG PO TABS
2.0000 | ORAL_TABLET | Freq: Four times a day (QID) | ORAL | Status: DC | PRN
  Administered 2013-12-05 – 2013-12-07 (×6): 2 via ORAL
  Filled 2013-12-05: qty 2
  Filled 2013-12-05: qty 1
  Filled 2013-12-05 (×5): qty 2

## 2013-12-05 NOTE — Progress Notes (Signed)
Patient did not attend the evening speaker AA meeting. Pt acted as if he was getting up for group but returned to bed.

## 2013-12-05 NOTE — Progress Notes (Signed)
Patient ID: Dale Contreras, male   DOB: 06/07/1971, 43 y.o.   MRN: [id number] He has beenu p and interacting more today talking to staff and peers. Has requested and received prn medication for pain twice.  Pain was decreased. Self inventory: depression and hopeless at 4, denies withdrawal symptoms and SI thoughts.

## 2013-12-05 NOTE — Progress Notes (Signed)
D:Patient in bed during this assessment. He endorsed having a good day ; although complaint of pain of 6 on a scale of 1-10 with 10 the worst on his neck. He requested for Robaxin and vistaril. Patient denied SI/HI and denied Hallucinations. A:Encouraged and supported patient. PRN Robaxin and Vistaril given as ordered. R: Q 15 minute check continues as ordered to maintain safety.
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
## 2013-12-05 NOTE — B[facility] Suicide Risk Assessment (Signed)
[facility] INPATIENT:  Family/Significant Other Suicide Prevention Education  Suicide Prevention Education:  Education Completed; Mechele CollinBecky Hypolite - mother [phone]),  (name of family member/significant other) has been identified by the patient as the family member/significant other with whom the patient will be residing, and identified as the person(s) who will aid the patient in the event of a mental health crisis (suicidal ideations/suicide attempt).  With written consent from the patient, the family member/significant other has been provided the following suicide prevention education, prior to the and/or following the discharge of the patient.  The suicide prevention education provided includes the following:  Suicide risk factors  Suicide prevention and interventions  National Suicide Hotline telephone number  [facility] assessment telephone number  [facility] Emergency Assistance 911  [facility] and/or Residential Mobile Crisis Unit telephone number  Request made of family/significant other to:  Remove weapons (e.g., guns, rifles, knives), all items previously/currently identified as safety concern.    Remove drugs/medications (over-the-counter, prescriptions, illicit drugs), all items previously/currently identified as a safety concern.  The family member/significant other verbalizes understanding of the suicide prevention education information provided.  The family member/significant other agrees to remove the items of safety concern listed above. Mother states that she believes pt needs further inpatient treatment as she believes his addiction has gotten really bad.  Mother states that she knows pt uses pills but has also found needles in his room.  Mother states that pt has failed with outpatient and thinks he should go somewhere else after detox.  Mother states that pt doesn't have a license due to a DUI and she doesn't trust him driving her car, therefore  outpatient being difficult for pt anyways.  Mother states that she'd rather pt not come back to her home until he goes for more inpatient treatment.    Carmina MillerHorton, Elianna Windom Nicole 12/05/2013, 3:20 PM

## 2013-12-05 NOTE — BHH Group Notes (Signed)
Adult Psychoeducational Group Note  Date:  12/05/2013 Time:  11:25 AM  Group Topic/Focus:  Dimensions of Wellness:   The focus of this group is to introduce the topic of wellness and discuss the role each dimension of wellness plays in total health.  Participation Level:  Active  Participation Quality:  Sharing  Affect:  Appropriate  Cognitive:  Appropriate  Insight: Good  Engagement in Group:  Engaged  Modes of Intervention:  Discussion, Education, Exploration and Support  Additional Comments:  Pt identified different self-care activities that will be implemented after discharge.  Gifford Ballon P 12/05/2013, 11:25 AM

## 2013-12-05 NOTE — Tx Team (Signed)
Interdisciplinary Treatment Plan Update (Adult)  Date: 12/05/2013  Time Reviewed:  9:45 AM  Progress in Treatment: Attending groups: Yes Participating in groups:  Yes Taking medication as prescribed:  Yes Tolerating medication:  Yes Family/Significant othe contact made: CSW assessing Patient understands diagnosis:  Yes Discussing patient identified problems/goals with staff:  Yes Medical problems stabilized or resolved:  Yes Denies suicidal/homicidal ideation: Yes Issues/concerns per patient self-inventory:  Yes Other:  New problem(s) identified: N/A  Discharge Plan or Barriers: CSW assessing for appropriate referrals.    Reason for Continuation of Hospitalization: Anxiety Depression Medication Stabilization Detox  Comments: N/A  Estimated length of stay: 3-5 days  For review of initial/current patient goals, please see plan of care.  Attendees: Patient:     Family:     Physician:  Dr. Lugo 12/05/2013 10:00 AM   Nursing:   Donna Shimp, RN 12/05/2013 10:00 AM   Clinical Social Worker:  Elba Dendinger Horton, LCSW 12/05/2013 10:00 AM   Other: Jennifer Clark, RN case manager 12/05/2013 10:00 AM   Other:  Heather Smart, LCSWA 12/05/2013 10:00 AM   Other:  Troy Pagel, RN 12/05/2013 10:00 AM   Other:     Other:    Other:    Other:    Other:    Other:    Other:     Scribe for Treatment Team:   Horton, Kenzli Barritt Nicole, 12/05/2013 , 10:00 AM     

## 2013-12-05 NOTE — [facility] Group Notes (Signed)
[facility]   12/05/2013 8:45 AM  Participation Quality:  Alert, Appropriate and Oriented  Mood/Affect:  Bright  Depression Rating:  2  Anxiety Rating:  4  Thoughts of Suicide:  Pt denies SI/HI  Will you contract for safety?   Yes  Current AVH:  Pt denies  Plan for Discharge/Comments:  Pt attended discharge planning group and actively participated in group.  CSW provided pt with today's workbook.  Pt states that he is doing well today.  [place] [facility] and therapy.  Pt requesting CSW to fax a letter to pt's attorney for a missed court date on Friday.  CSW will fax this letter today.  No further needs voiced by pt at this time.    Transportation Means: Pt reports access to transportation - mom will pick pt up  Supports: No supports mentioned at this time  Reyes IvanChelsea Horton, LCSW 12/05/2013 9:49 AM

## 2013-12-05 NOTE — Progress Notes (Signed)
Pt reports having a good evening however verbal altercation between fellow peers did cause patient to request a prn dose of vistaril. Had also been given trazadone for sleep. Refusing clonidine and denies any withdrawal. Also denies pain at this time. Medicated per orders. Support, encouragement given. On reassess, pt calmer. Denies SI/HI/AVH and remains safe. Lawrence MarseillesFriedman, Katessa Attridge Eakes

## 2013-12-05 NOTE — Progress Notes (Signed)
[facility] MD Progress Note  12/05/2013 7:30 PM Dale Contreras  MRN:  [id number] Subjective:  Dearies continues to detox. Not sure what to do with his pain. States that he has chronic pain as well as TBI. He states he has hit his head multiple times. The first time he fell from up high receiving trauma to his head and losing consciousness. He states he has memory problems. He has had these problems for a long time and they are getting better. States he does not remember procedures form day to day so they have to keep repeating things for him. In the job setting he gets along well with people, co workers but cant keep up with the demands of the work. States the is very frustrating Diagnosis:   DSM5: Schizophrenia Disorders:  none Obsessive-Compulsive Disorders:  none Trauma-Stressor Disorders:  none Substance/Addictive Disorders:  Opioid Disorder - Severe (304.00) Depressive Disorders:  Major Depressive Disorder - Moderate (296.22) Total Time spent with patient: 30 minutes  Axis I: Generalized Anxiety Disorder  ADL's:  Intact  Sleep: Fair  Appetite:  Fair  Suicidal Ideation:  Plan:  denies Intent:  denies Means:  denies Homicidal Ideation:  Plan:  denies AEB (as evidenced by):  Psychiatric Specialty Exam: Physical Exam  Review of Systems  Constitutional: Positive for malaise/fatigue.  Eyes: Negative.   Respiratory: Negative.   Cardiovascular: Negative.   Gastrointestinal: Negative.   Genitourinary: Negative.   Musculoskeletal: Positive for back pain, joint pain and neck pain.  Skin: Negative.   Neurological: Positive for headaches.  Endo/Heme/Allergies: Negative.   Psychiatric/Behavioral: Positive for substance abuse. The patient is nervous/anxious and has insomnia.     Blood pressure 113/79, pulse 81, temperature 97.5 F (36.4 C), temperature source Oral, resp. rate 16, height 6' 8.5" (2.045 m), weight 66.679 kg (147 lb), SpO2 100.00%.Body mass index is 15.94 kg/(m^2).  General  Appearance: Fairly Groomed  Patent attorney::  Fair  Speech:  Clear and Coherent  Volume:  Normal  Mood:  Anxious and worried  Affect:  anxious, worried  Thought Process:  Coherent and Goal Directed  Orientation:  Full (Time, Place, and Person)  Thought Content:  symptoms, worries, concerns  Suicidal Thoughts:  No  Homicidal Thoughts:  No  Memory:  Immediate;   Fair Recent;   Poor Remote;   Fair  Judgement:  Fair  Insight:  Present  Psychomotor Activity:  Restlessness  Concentration:  Fair  Recall:  Poor  Fund of Knowledge:NA  Language: Fair  Akathisia:  No  Handed:    AIMS (if indicated):     Assets:  Desire for Improvement  Sleep:  Number of Hours: 6.25   Musculoskeletal: Strength & Muscle Tone: within normal limits Gait & Station: normal Patient leans: N/A  Current Medications: Current Facility-Administered Medications  Medication Dose Route Frequency Provider Last Rate Last Dose  . acetaminophen (TYLENOL) tablet 650 mg  650 mg Oral Q6H PRN Kristeen Mans, NP   650 mg at 12/02/13 1629  . alum & mag hydroxide-simeth (MAALOX/MYLANTA) 200-200-20 MG/5ML suspension 30 mL  30 mL Oral Q4H PRN Nanine Means, NP      . butalbital-acetaminophen-caffeine (FIORICET, ESGIC) [phone] MG per tablet 2 tablet  2 tablet Oral Q6H PRN Rachael Fee, MD   2 tablet at 12/05/13 1657  . cloNIDine (CATAPRES) tablet 0.1 mg  0.1 mg Oral BH-qamhs Kristeen Mans, NP   0.1 mg at 12/04/13 1255   Followed by  . [START ON 12/07/2013] cloNIDine (CATAPRES) tablet  0.1 mg  0.1 mg Oral QAC breakfast Kristeen MansFran E Hobson, NP      . dicyclomine (BENTYL) tablet 20 mg  20 mg Oral Q6H PRN Kristeen MansFran E Hobson, NP   20 mg at 12/02/13 1629  . hydrOXYzine (ATARAX/VISTARIL) tablet 25 mg  25 mg Oral Q6H PRN Kristeen MansFran E Hobson, NP   25 mg at 12/05/13 0858  . loperamide (IMODIUM) capsule 2-4 mg  2-4 mg Oral PRN Kristeen MansFran E Hobson, NP      . magnesium hydroxide (MILK OF MAGNESIA) suspension 30 mL  30 mL Oral Daily PRN Kristeen MansFran E Hobson, NP      .  methocarbamol (ROBAXIN) tablet 500 mg  500 mg Oral Q8H PRN Kristeen MansFran E Hobson, NP   500 mg at 12/05/13 0858  . naproxen (NAPROSYN) tablet 500 mg  500 mg Oral BID PRN Kristeen MansFran E Hobson, NP   500 mg at 12/05/13 0858  . ondansetron (ZOFRAN-ODT) disintegrating tablet 4 mg  4 mg Oral Q6H PRN Kristeen MansFran E Hobson, NP      . pregabalin (LYRICA) capsule 200 mg  200 mg Oral BID Truman Haywardakia S Starkes, FNP   200 mg at 12/05/13 1655  . traZODone (DESYREL) tablet 50 mg  50 mg Oral QHS PRN Kristeen MansFran E Hobson, NP   50 mg at 12/04/13 2154  . venlafaxine XR (EFFEXOR-XR) 24 hr capsule 75 mg  75 mg Oral Q breakfast Truman Haywardakia S Starkes, FNP   75 mg at 12/05/13 0840    Lab Results: No results found for this or any previous visit (from the past 48 hour(s)).  Physical Findings: AIMS: Facial and Oral Movements Muscles of Facial Expression: None, normal Lips and Perioral Area: None, normal Jaw: None, normal Tongue: None, normal,Extremity Movements Upper (arms, wrists, hands, fingers): None, normal Lower (legs, knees, ankles, toes): None, normal, Trunk Movements Neck, shoulders, hips: None, normal, Overall Severity Severity of abnormal movements (highest score from questions above): None, normal Incapacitation due to abnormal movements: None, normal Patient's awareness of abnormal movements (rate only patient's report): No Awareness, Dental Status Current problems with teeth and/or dentures?: No Does patient usually wear dentures?: No  CIWA:  CIWA-Ar Total: 2 COWS:  COWS Total Score: 1  Treatment Plan Summary: Daily contact with patient to assess and evaluate symptoms and progress in treatment Medication management  Plan: Supportive approach/coping skills/relapse prevention           Continue Detox           Optimize treatment with Effexor           Requested a trial with Fioricet for HA  Medical Decision Making Problem Points:  Review of psycho-social stressors (1) Data Points:  Review of medication regiment & side effects  (2) Review of new medications or change in dosage (2)  I certify that inpatient services furnished can reasonably be expected to improve the patient's condition.   Kaelan Emami A 12/05/2013, 7:30 PM

## 2013-12-05 NOTE — Progress Notes (Signed)
Adult Psychoeducational Group Note  Date:  12/05/2013 Time:  11:01 PM  Group Topic/Focus:  Goals Group:   The focus of this group is to help patients establish daily goals to achieve during treatment and discuss how the patient can incorporate goal setting into their daily lives to aide in recovery.  Participation Level:  Active  Participation Quality:  Appropriate  Affect:  Appropriate  Cognitive:  Appropriate  Insight: Appropriate  Engagement in Group:  Engaged  Modes of Intervention:  Discussion  Additional Comments:  Pt stated that he found some positive ways to deal with his pain in a more productive manner.  Terie PurserParker, Macklyn Glandon R 12/05/2013, 11:01 PM

## 2013-12-06 DIAGNOSIS — F1994 Other psychoactive substance use, unspecified with psychoactive substance-induced mood disorder: Secondary | ICD-10-CM

## 2013-12-06 NOTE — Consult Note (Signed)
Did not see or review this patient

## 2013-12-06 NOTE — Progress Notes (Signed)
Recreation Therapy Notes   Animal-Assisted Activity/Therapy (AAA/T) Program Checklist/Progress Notes Patient Eligibility Criteria Checklist & Daily Group note for Rec Tx Intervention  Date: 03.10.2015 Time: 2:45pm Location: [address] Day[address]    AAA/T Program Assumption of Risk Form signed by Patient/ or Parent Legal Guardian yes  Patient is free of allergies or sever asthma yes  Patient reports no fear of animals yes  Patient reports no history of cruelty to animals yes   Patient understands his/her participation is voluntary yes  Patient washes hands before animal contact yes  Patient washes hands after animal contact yes  Behavioral Response: Engaged, Appropriate   Education: Hand Washing, Appropriate Animal Interaction   Education Outcome: Acknowledges understanding   Clinical Observations/Feedback: Patient interacted appropriately with dog team and group members. Patient shared stories about his pet at home.    Marykay Lexenise L Kristan Brummitt, LRT/CTRS  Mishelle Hassan L 12/06/2013 4:02 PM

## 2013-12-06 NOTE — Progress Notes (Signed)
[facility]  12/06/2013 5:56 PM Dale RuskDavid E XXXDuncan  MRN:  [id number] Subjective:  Dale MarchDuncan endorses that the Fioricet help him. States he needs to find alternatives to deal with his pain. The pain gets in the way of his ability to function. He is willing to look into different options of pain management. Has done acupuncture in the past but does not feel it was successfull.  His mother wants him to do rehab as she does not feel he will be able to abstain without more help.  Diagnosis:   DSM5: Schizophrenia Disorders:  none Obsessive-Compulsive Disorders:  none Trauma-Stressor Disorders:  none Substance/Addictive Disorders:  Opioid Disorder - Severe (304.00) Depressive Disorders:  Major Depressive Disorder - Moderate (296.22) Total Time spent with patient: 30 minutes  Axis I: Substance Induced Mood Disorder  ADL's:  Intact  Sleep: Fair  Appetite:  Fair  Suicidal Ideation:  Plan:  denies Intent:  denies Means:  denies Homicidal Ideation:  Plan:  denies Intent:  denies Means:  denies AEB (as evidenced by):  Psychiatric Specialty Exam: Physical Exam  Review of Systems  Constitutional: Negative.   Eyes: Negative.   Respiratory: Negative.   Cardiovascular: Negative.   Gastrointestinal: Negative.   Genitourinary: Positive for frequency.  Musculoskeletal: Positive for back pain and neck pain.  Skin: Negative.   Neurological: Negative.   Psychiatric/Behavioral: Positive for substance abuse. The patient is nervous/anxious.     Blood pressure 122/85, pulse 87, temperature 97.9 F (36.6 C), temperature source Oral, resp. rate 16, height 6' 8.5" (2.045 m), weight 66.679 kg (147 lb), SpO2 100.00%.Body mass index is 15.94 kg/(m^2).  General Appearance: Fairly Groomed  Patent attorneyye Contact::  Fair  Speech:  Clear and Coherent  Volume:  Normal  Mood:  Anxious and worried  Affect:  anxious, worried  Thought Process:  Coherent and Goal Directed  Orientation:  Full (Time, Place, and  Person)  Thought Content:  symtpoms, worries, concerns  Suicidal Thoughts:  No  Homicidal Thoughts:  No  Memory:  Immediate;   Fair Recent;   Fair Remote;   Fair  Judgement:  Fair  Insight:  Fair  Psychomotor Activity:  Restlessness  Concentration:  Fair  Recall:  FiservFair  Fund of Knowledge:NA  Language: Fair  Akathisia:  No  Handed:    AIMS (if indicated):     Assets:  Desire for Improvement  Sleep:  Number of Hours: 6   Musculoskeletal: Strength & Muscle Tone: within normal limits Gait & Station: normal Patient leans: N/A  Current Medications: Current Facility-Administered Medications  Medication Dose Route Frequency Provider Last Rate Last Dose  . acetaminophen (TYLENOL) tablet 650 mg  650 mg Oral Q6H PRN Kristeen MansFran E Hobson, NP   650 mg at 12/02/13 1629  . alum & mag hydroxide-simeth (MAALOX/MYLANTA) 200-200-20 MG/5ML suspension 30 mL  30 mL Oral Q4H PRN Nanine MeansJamison Lord, NP      . butalbital-acetaminophen-caffeine (FIORICET, ESGIC) [phone] MG per tablet 2 tablet  2 tablet Oral Q6H PRN Rachael FeeIrving A Ondria Oswald, MD   2 tablet at 12/06/13 1202  . cloNIDine (CATAPRES) tablet 0.1 mg  0.1 mg Oral BH-qamhs Kristeen MansFran E Hobson, NP   0.1 mg at 12/06/13 [id number]   Followed by  . [START ON 12/07/2013] cloNIDine (CATAPRES) tablet 0.1 mg  0.1 mg Oral QAC breakfast Kristeen MansFran E Hobson, NP      . dicyclomine (BENTYL) tablet 20 mg  20 mg Oral Q6H PRN Kristeen MansFran E Hobson, NP   20 mg at 12/02/13 1629  .  hydrOXYzine (ATARAX/VISTARIL) tablet 25 mg  25 mg Oral Q6H PRN Kristeen Mans, NP   25 mg at 12/05/13 2010  . loperamide (IMODIUM) capsule 2-4 mg  2-4 mg Oral PRN Kristeen Mans, NP      . magnesium hydroxide (MILK OF MAGNESIA) suspension 30 mL  30 mL Oral Daily PRN Kristeen Mans, NP      . methocarbamol (ROBAXIN) tablet 500 mg  500 mg Oral Q8H PRN Kristeen Mans, NP   500 mg at 12/06/13 0819  . naproxen (NAPROSYN) tablet 500 mg  500 mg Oral BID PRN Kristeen Mans, NP   500 mg at 12/05/13 0858  . ondansetron (ZOFRAN-ODT) disintegrating  tablet 4 mg  4 mg Oral Q6H PRN Kristeen Mans, NP      . pregabalin (LYRICA) capsule 200 mg  200 mg Oral BID Truman Hayward, FNP   200 mg at 12/06/13 1711  . traZODone (DESYREL) tablet 50 mg  50 mg Oral QHS PRN Kristeen Mans, NP   50 mg at 12/05/13 2131  . venlafaxine XR (EFFEXOR-XR) 24 hr capsule 75 mg  75 mg Oral Q breakfast Truman Hayward, FNP   75 mg at 12/06/13 1610    Lab Results: No results found for this or any previous visit (from the past 48 hour(s)).  Physical Findings: AIMS: Facial and Oral Movements Muscles of Facial Expression: None, normal Lips and Perioral Area: None, normal Jaw: None, normal Tongue: None, normal,Extremity Movements Upper (arms, wrists, hands, fingers): None, normal Lower (legs, knees, ankles, toes): None, normal, Trunk Movements Neck, shoulders, hips: None, normal, Overall Severity Severity of abnormal movements (highest score from questions above): None, normal Incapacitation due to abnormal movements: None, normal Patient's awareness of abnormal movements (rate only patient's report): No Awareness, Dental Status Current problems with teeth and/or dentures?: No Does patient usually wear dentures?: No  CIWA:  CIWA-Ar Total: 0 COWS:  COWS Total Score: 1  Treatment Plan Summary: Daily contact with patient to assess and evaluate symptoms and progress in treatment Medication management  Plan: Supportive approach/copign skills/relapse prevention           Complete detox           Reassess and address the co morbidities  Medical Decision Making Problem Points:  Review of psycho-social stressors (1) Data Points:  Review of medication regiment & side effects (2)  I certify that inpatient services furnished can reasonably be expected to improve the patient's condition.   Tomy Khim A 12/06/2013, 5:56 PM

## 2013-12-06 NOTE — Progress Notes (Signed)
Patient ID: Dale RuskDavid E Contreras, male   DOB: 12/28/1970, 43 y.o.   MRN: [id number] He has been up and to groups interacting with peers and staff,Has been joking around with peers. He has requested and received prn for pain at 1757 for his neck. Self inventory: Depression 2, hopelessness 3, denies withdrawal symptoms and SI thoughts. Stated that he was feeling much better and that the pain medication was effective.

## 2013-12-06 NOTE — [facility] Group Notes (Signed)
[facility] LCSW Group Therapy  12/06/2013  1:15 PM   Type of Therapy:  Group Therapy  Participation Level:  Active  Participation Quality:  Attentive and Supportive  Affect:  Depressed and Flat  Cognitive:  Alert and Oriented  Insight:  Developing/Improving and Engaged  Engagement in Therapy:  Developing/Improving and Engaged  Modes of Intervention:  Activity, Clarification, Discussion, Education, Exploration, Limit-setting, Orientation, Problem-solving, Rapport Building, Reality Testing, Socialization and Support  Summary of Progress/Problems: .Patient was attentive and engaged with speaker from [employer].  Patient was attentive to speaker while they shared their story of dealing with mental health and overcoming it.  Patient expressed interest in their programs and services and received information on their agency.  Patient processed ways they can relate to the speaker.     Kelty Szafran Horton, LCSW 12/06/2013 1:24 PM   

## 2013-12-07 DIAGNOSIS — F1120 Opioid dependence, uncomplicated: Principal | ICD-10-CM

## 2013-12-07 MED ORDER — TRAZODONE HCL 50 MG PO TABS: 50.0000 mg | ORAL_TABLET | Freq: Every evening | ORAL | Status: AC | PRN

## 2013-12-07 MED ORDER — HYDROXYZINE HCL 25 MG PO TABS: ORAL_TABLET | ORAL | Status: AC

## 2013-12-07 MED ORDER — VENLAFAXINE HCL ER 75 MG PO CP24: 75.0000 mg | ORAL_CAPSULE | Freq: Every day | ORAL | Status: AC

## 2013-12-07 MED ORDER — HYDROXYZINE HCL 25 MG PO TABS
25.0000 mg | ORAL_TABLET | Freq: Three times a day (TID) | ORAL | Status: DC | PRN
  Filled 2013-12-07: qty 30

## 2013-12-07 NOTE — [facility] Group Notes (Signed)
[facility] LCSW Group Therapy  12/07/2013  1:15 PM   Type of Therapy:  Group Therapy  Participation Level:  Active  Participation Quality:  Appropriate, Sharing and Supportive  Affect:  Calm  Cognitive:  Alert and Oriented  Insight:  Developing/Improving and Engaged  Engagement in Therapy:  Developing/Improving and Engaged  Modes of Intervention:  Clarification, Confrontation, Discussion, Education, Exploration, Limit-setting, Orientation, Problem-solving, Rapport Building, Dance movement psychotherapisteality Testing, Socialization and Support  Summary of Progress/Problems: The topic for group today was emotional regulation.  This group focused on both positive and negative emotion identification and allowed group members to process ways to identify feelings, regulate negative emotions, and find healthy ways to manage internal/external emotions. Group members were asked to reflect on a time when their reaction to an emotion led to a negative outcome and explored how alternative responses using emotion regulation would have benefited them. Group members were also asked to discuss a time when emotion regulation was utilized when a negative emotion was experienced.  Pt shared that he feels worthless for having to live with his mom for the last 9 years.  Pt discussed feelings of anger and depression towards his ex wife which he copes with by using.  Pt states that he is working on communicating better instead of resorting to using.  Pt actively participated and was engaged in group discussion.    Dale IvanChelsea Horton, LCSW 12/07/2013 2:42 PM

## 2013-12-07 NOTE — Discharge Summary (Signed)
Physician Discharge Summary Note  Patient:  Dale Contreras is an 43 y.o., male MRN:  [id number] DOB:  09/05/1971 Patient phone:  [phone] (home)  Patient address:   [address] [place] [address],  Total Time spent with patient: Greater  Date of Admission:  12/02/2013  Date of Discharge: 12/07/13  Reason for Admission:  Opioid detox  Discharge Diagnoses: Active Problems:   Opiate dependence   Opioid dependence   Psychiatric Specialty Exam: Physical Exam  Constitutional: He is oriented to person, place, and time. He appears well-developed.  HENT:  Head: Normocephalic.  Eyes: Pupils are equal, round, and reactive to light.  Neck: Normal range of motion.  Cardiovascular: Normal rate.   Respiratory: Effort normal.  GI: Soft.  Genitourinary:  Denies any issues in this area  Musculoskeletal: Normal range of motion.  Neurological: He is alert and oriented to person, place, and time.  Skin: Skin is warm and dry.  Psychiatric: His speech is normal and behavior is normal. Judgment and thought content normal. His mood appears not anxious. His affect is not angry, not blunt, not labile and not inappropriate. Cognition and memory are normal. He does not exhibit a depressed mood.    Review of Systems  Constitutional: Negative.   HENT: Negative.   Eyes: Negative.   Respiratory: Negative.   Cardiovascular: Negative.   Gastrointestinal: Negative.   Genitourinary: Negative.   Musculoskeletal: Negative.   Skin: Negative.   Neurological: Negative.   Endo/Heme/Allergies: Negative.   Psychiatric/Behavioral: Positive for depression (Stable) and substance abuse (Opioid dependence). Negative for suicidal ideas, hallucinations and memory loss. The patient has insomnia. The patient is not nervous/anxious.     Blood pressure 117/74, pulse 83, temperature 98.3 F (36.8 C), temperature source Oral, resp. rate 18, height 6' 8.5" (2.045 m), weight 66.679 kg (147 lb), SpO2  100.00%.Body mass index is 15.94 kg/(m^2).  General Appearance: Casual and Fairly Groomed  Patent attorneyye Contact::  Good  Speech:  Clear and Coherent  Volume:  Normal  Mood:  Stable  Affect:  Appropriate and Congruent  Thought Process:  Coherent and Goal Directed  Orientation:  Full (Time, Place, and Person)  Thought Content:  Denies any hallucinations, delusional thoughts and or paranoia  Suicidal Thoughts:  No  Homicidal Thoughts:  No  Memory:  Immediate;   Good Recent;   Good Remote;   Good  Judgement:  Good  Insight:  Present  Psychomotor Activity:  Normal  Concentration:  Good  Recall:  Good  Fund of Knowledge:Fair  Language: Good  Akathisia:  No  Handed:  Right  AIMS (if indicated):     Assets:  Desire for Improvement  Sleep:  Number of Hours: 2.5    Past Psychiatric History: Diagnosis: Opioid Disorder - Severe (304.00)  [facility]  Outpatient Care: [facility]  Substance Abuse Care: [facility]  Self-Mutilation: Denies  Suicidal Attempts: Denies  Violent Behaviors: Denies   Musculoskeletal: Strength & Muscle Tone: within normal limits Gait & Station: normal Patient leans: N/A  DSM5: Schizophrenia Disorders:  NA Obsessive-Compulsive Disorders:  NA Trauma-Stressor Disorders:  NA Substance/Addictive Disorders:  Opioid Disorder - Severe (304.00) Depressive Disorders:  NA  Axis Diagnosis:   AXIS I:  Opioid Disorder - Severe (304.00) AXIS II:  Deferred AXIS III:   Past Medical History  Diagnosis Date  . Renal disorder   . Colitis   . Kidney stones   . History of spinal fusion   . Chronic pain  AXIS IV:  Opioid dependence AXIS V:  62  Level of Care: Home, then to RTC, OP  Hospital Course: Dale Contreras is a 43 year old male presents to [facility] for detox from opiates. He was transported here by his mother. He reports he was tired of living the way he was living, the drugs kind of took control of me. He reports an 8 year history of opiate  abuse. He reports having a spinal fusion(C5-C7) in 2007 , it was the worst thing I ever did having that surgery. After the surgery I have been in constant pain."Its scary that is what is bothering me, If I go back out in the streets and I cant control the pain I don't know what I am going to do?"   Dale Contreras was admitted with UDS report showing + opioid and amphetamine. He was seeking drug detox. He was then ordered and received Clonidine detox protocols. Dale Contreras was also enrolled in group counseling sessions and AA/NA meetings to learn coping skills that should help him cope better after discharge. Besides the detox treatment, Mr. Mckenzie also was ordered and received Effexor XR 75 mg daily for depression and Trazodone 50 mg Q bedtime for sleep. He received medication management for other medical complaints that he presented. He tolerated his treatment regimen without any adverse effects reported.  Dale Contreras has completed detox treatment. His mood also stabilized. He is currently being discharged to follow-up care at the [facility] clinic here in [place], [place] for medication management/routine psychiatric care and for substance abuse treatment he receive this service at the [facility]. He has been provided with all the pertinent information needed to make these appointments without problems. Upon discharge, he adamantly denies any SIHI, AVH, delusions, paranoia and or withdrawal symptoms. He received 2 weeks worth supply samples of his [facility] discharge medications. He left [facility] with all belongings in no apparent distress. Transportation per family.  Consults:  psychiatry  Significant Diagnostic Studies:  labs: CBC with diff, CMP, UDS toxicology, U/A  Discharge Vitals:   Blood pressure 117/74, pulse 83, temperature 98.3 F (36.8 C), temperature source Oral, resp. rate 18, height 6' 8.5" (2.045 m), weight 66.679 kg (147 lb), SpO2 100.00%. Body mass index is 15.94 kg/(m^2). Lab Results:   No results found for  this or any previous visit (from the past 72 hour(s)).  Physical Findings: AIMS: Facial and Oral Movements Muscles of Facial Expression: None, normal Lips and Perioral Area: None, normal Jaw: None, normal Tongue: None, normal,Extremity Movements Upper (arms, wrists, hands, fingers): None, normal Lower (legs, knees, ankles, toes): None, normal, Trunk Movements Neck, shoulders, hips: None, normal, Overall Severity Severity of abnormal movements (highest score from questions above): None, normal Incapacitation due to abnormal movements: None, normal Patient's awareness of abnormal movements (rate only patient's report): No Awareness, Dental Status Current problems with teeth and/or dentures?: No Does patient usually wear dentures?: No  CIWA:  CIWA-Ar Total: 0 COWS:  COWS Total Score: 0  Psychiatric Specialty Exam: See Psychiatric Specialty Exam and Suicide Risk Assessment completed by Attending Physician prior to discharge.  Discharge destination:  Other:  Home, then to [facility]  Is patient on multiple antipsychotic therapies at discharge:  No   Has Patient had three or more failed trials of antipsychotic monotherapy by history:  No  Recommended Plan for Multiple Antipsychotic Therapies: NA     Medication List       Indication   hydrOXYzine 25 MG tablet  Commonly known as:  ATARAX/VISTARIL  Take 25 mg (1 tablet) three times daily as needed for anxiety/tension   Indication:  Tension, Anxiety     traZODone 50 MG tablet  Commonly known as:  DESYREL  Take 1 tablet (50 mg total) by mouth at bedtime as needed for sleep.   Indication:  Trouble Sleeping     venlafaxine XR 75 MG 24 hr capsule  Commonly known as:  EFFEXOR-XR  Take 1 capsule (75 mg total) by mouth daily with breakfast. For depression   Indication:  Generalized Anxiety Disorder, Major Depressive Disorder       Follow-up Information   Follow up with [facility] On 12/09/2013. (Walk in on this date for  hospital discharge appointment. Walk in clinic is Monday - Friday 8 am - 3 pm. They will than schedule you for medication management and therapy)    Contact information:   [address], [place] [address] Phone: [phone] Fax: [phone]      Follow up with [facility]. (Call to schedule an intake screening.  )    Contact information:   [address] [place], [place] [address] Phone: [phone] Fax: [phone]     Follow-up recommendations:  Activity:  As tolerated Diet: As recommended by your primary care doctor. Keep all scheduled follow-up appointments as recommended.  Comments:  Take all your medications as prescribed by your mental healthcare provider. Report any adverse effects and or reactions from your medicines to your outpatient provider promptly. Patient is instructed and cautioned to not engage in alcohol and or illegal drug use while on prescription medicines. In the event of worsening symptoms, patient is instructed to call the crisis hotline, 911 and or go to the nearest ED for appropriate evaluation and treatment of symptoms. Follow-up with your primary care provider for your other medical issues, concerns and or health care needs.   Total Discharge Time:  Greater than 30 minutes.  Signed: Sanjuana Kava, PMHNP, FNP 12/07/2013, 11:53 AM Personally evaluated the patient, formulated the plan and agree with the assessment  Madie Reno A. Dub Mikes, M.D.

## 2013-12-07 NOTE — Progress Notes (Signed)
[facility] Dis[facility] :  Will you be returning to the same living situation after discharge: Yes,  returning home At discharge, do you have transportation home?:Yes,  mother will pick pt up today Do you have the ability to pay for your medications:Yes,  provided pt with samples and prescriptions and [facility] for [facility] with affording meds  Release of information consent forms completed and in the chart;  Patient's signature needed at discharge.  Patient to Follow up at: Follow-up Information   Follow up with [facility] On 12/09/2013. (Walk in on this date for hospital discharge appointment. Walk in clinic is Monday - Friday 8 am - 3 pm. They will than schedule you for medication management and therapy)    Contact information:   [address] [place], [place] [address] Phone: [phone] Fax: [phone]      Follow [facility] with [facility]. (Call to schedule an intake screening.  )    Contact information:   [address] [place], [place] [address] Phone: [phone] Fax: [phone]      Patient denies SI/HI:   Yes,  denies SI/HI    Safety Planning and Suicide Prevention discussed:  Yes,  discussed with pt and pt's mother.  See suicide prevention education note.   Carmina MillerHorton, Atalaya Zappia Nicole 12/07/2013, 11:13 AM

## 2013-12-07 NOTE — BHH Suicide Risk Assessment (Signed)
Suicide Risk Assessment  Discharge Assessment     Demographic Factors:  Male and Caucasian  Total Time spent with patient: 45 minutes  Psychiatric Specialty Exam:     Blood pressure 117/74, pulse 83, temperature 98.3 F (36.8 C), temperature source Oral, resp. rate 18, height 6' 8.5" (2.045 m), weight 66.679 kg (147 lb), SpO2 100.00%.Body mass index is 15.94 kg/(m^2).  General Appearance: Fairly Groomed  Patent attorneyye Contact::  Fair  Speech:  Clear and Coherent  Volume:  Normal  Mood:  Anxious and worried  Affect:  Appropriate  Thought Process:  Coherent and Goal Directed  Orientation:  Full (Time, Place, and Person)  Thought Content:  symtpoms, worries, concerns about his pain management, relapse prevention plan  Suicidal Thoughts:  No  Homicidal Thoughts:  No  Memory:  Immediate;   Fair Recent;   Fair Remote;   Fair  Judgement:  Fair  Insight:  Present  Psychomotor Activity:  Restlessness  Concentration:  Fair  Recall:  Fis[employer]Fair  Fund of Knowledge:NA  Language: Fair  Akathisia:  No  Handed:    AIMS (if indicated):     Assets:  Desire for Improvement Housing Social Support  Sleep:  Number of Hours: 2.5    Musculoskeletal: Strength & Muscle Tone: within normal limits Gait & Station: normal Patient leans: N/A   Mental Status Per Nursing Assessment::   On Admission:  NA  Current Mental Status by Physician: In full contact with reality. No evidence of active S/S of withdrawal. He asked to have a prescription for some Fioricet until he can see a PCP. States the Fioricet really worked   Loss Factors: Decline in physical health  Historical Factors: NA  Risk Reduction Factors:   Sense of responsibility to family, Living with another person, especially a relative and Positive social support  Continued Clinical Symptoms:  Alcohol/Substance Abuse/Dependencies  Cognitive Features That Contribute To Risk:  Closed-mindedness Polarized thinking Thought constriction (tunnel  vision)    Suicide Risk:  Minimal: No identifiable suicidal ideation.  Patients presenting with no risk factors but with morbid ruminations; may be classified as minimal risk based on the severity of the depressive symptoms  Discharge Diagnoses:   AXIS I:  Opiate Dependence S/P Opiate Withdrawal, Substance Induced Mood Disorder AXIS II:  Deferred AXIS III:   Past Medical History  Diagnosis Date  . Renal disorder   . Colitis   . Kidney stones   . History of spinal fusion   . Chronic pain    AXIS IV:  occupational problems and other psychosocial or environmental problems AXIS V:  61-70 mild symptoms  Plan Of Care/Follow-up recommendations:  Activity:  as tolerated Diet:  regular Follow up outpatient treatment Fioricet 1-2 TID PRN anxiety Is patient on multiple antipsychotic therapies at discharge:  No   Has Patient had three or more failed trials of antipsychotic monotherapy by history:  No  Recommended Plan for Multiple Antipsychotic Therapies: NA    Halbert Jesson A 12/07/2013, 2:52 PM

## 2013-12-07 NOTE — [facility] Group Notes (Signed)
[facility] L[facility] Dis[facility] Planning [facility] Note   12/07/2013 8:45 AM  Participation Quality:  Alert, Appropriate and Oriented  Mood/Affect:  Bright  Depression Rating:  1  Anxiety Rating:  9  Thoughts of Suicide:  Pt denies SI/HI  Will you contract for safety?   Yes  Current AVH:  Pt denies  Plan for Discharge/Comments:  Pt attended discharge planning group and actively participated in group.  CSW provided pt with today's workbook.  Pt states that he is doing well today and ready to d/c.  [place] and [facility] management and therapy.  Pt states that he can also stay with an aunt in [place] which is a positive environment.  Pt states that he plans [facility].  No further needs voiced by pt at this time.    Transportation Means: Pt reports access to transportation - mom will pick pt up  Supports: No supports mentioned at this time  Reyes IvanChelsea Horton, LCSW 12/07/2013 9:59 AM

## 2013-12-07 NOTE — Progress Notes (Signed)
Patient ID: Dale Contreras, male   DOB: 08/21/1971, 43 y.o.   MRN: [id number] He has been discharged ome and was picked up by his mother. He voiced understanding of discharge instructions and of the follow up plan. He denies SI thoughts and all his belonging were taken home with him.

## 2013-12-07 NOTE — Tx Team (Signed)
Interdisciplinary Treatment Plan Update (Adult)  Date: 12/07/2013  Time Reviewed:  9:45 AM  Progress in Treatment: Attending groups: Yes Participating in groups:  Yes Taking medication as prescribed:  Yes Tolerating medication:  Yes Family/Significant othe contact made: Yes, with pt's mother Patient understands diagnosis:  Yes Discussing patient identified problems/goals with staff:  Yes Medical problems stabilized or resolved:  Yes Denies suicidal/homicidal ideation: Yes Issues/concerns per patient self-inventory:  Yes Other:  New problem(s) identified: N/A  Discharge Plan or Barriers: Pt will [facility] for further inpatient treatment and [facility] for outpatient medication management and therapy.    Reason for Continuation of Hospitalization: Stable to d/c today  Comments: N/A  Estimated length of stay: D/C today  For review of initial/current patient goals, please see plan of care.  Attendees: Patient:     Family:     Physician:   12/07/2013 11:04 AM   Nursing:   Roswell Minersonna Shimp, RN 12/07/2013 11:04 AM   Clinical Social Worker:  Reyes Ivanhelsea Horton, LCSW 12/07/2013 11:04 AM   Other: Seabron Spatesoneica Byrd, RN 12/07/2013 11:04 AM   Other:  Trula SladeHeather Smart, LCSWA 12/07/2013 11:04 AM   Other:  Serena ColonelAggie Nwoko, NP 12/07/2013 11:04 AM   Other:     Other:    Other:    Other:    Other:    Other:    Other:     Scribe for Treatment Team:   Carmina MillerHorton, Ysabella Babiarz Nicole, 12/07/2013 , 11:04 AM

## 2013-12-12 NOTE — Progress Notes (Signed)
Patient Discharge Instructions:  After Visit Summary (AVS):   Faxed to:  12/12/13 Discharge Summary Note:   Faxed to:  12/12/13 Psychiatric Admission Assessment Note:   Faxed to:  12/12/13 Suicide Risk Assessment - Discharge Assessment:   Faxed to:  12/12/13 Faxed/Sent to the Next Level Care provider:  12/12/13 Faxed to [facility] @ [phone] Faxed to [facility] @ [phone]  Jerelene ReddenSheena E Mazomanie, 12/12/2013, 2:28 PM

## 2013-12-15 ENCOUNTER — Encounter (HOSPITAL_COMMUNITY): Payer: Self-pay | Admitting: Emergency Medicine

## 2013-12-15 ENCOUNTER — Emergency Department (HOSPITAL_COMMUNITY)
Admission: EM | Admit: 2013-12-15 | Discharge: 2013-12-15 | Disposition: A | Discharge status: Home / Self Care | Payer: Self-pay | Attending: Emergency Medicine | Admitting: Emergency Medicine

## 2013-12-15 DIAGNOSIS — Z79899 Other long term (current) drug therapy: Secondary | ICD-10-CM | POA: Insufficient documentation

## 2013-12-15 DIAGNOSIS — F911 Conduct disorder, childhood-onset type: Secondary | ICD-10-CM | POA: Insufficient documentation

## 2013-12-15 DIAGNOSIS — R4689 Other symptoms and signs involving appearance and behavior: Secondary | ICD-10-CM

## 2013-12-15 DIAGNOSIS — F319 Bipolar disorder, unspecified: Secondary | ICD-10-CM | POA: Insufficient documentation

## 2013-12-15 DIAGNOSIS — Z8719 Personal history of other diseases of the digestive system: Secondary | ICD-10-CM | POA: Insufficient documentation

## 2013-12-15 DIAGNOSIS — Z87448 Personal history of other diseases of urinary system: Secondary | ICD-10-CM | POA: Insufficient documentation

## 2013-12-15 DIAGNOSIS — IMO0002 Reserved for concepts with insufficient information to code with codable children: Secondary | ICD-10-CM | POA: Insufficient documentation

## 2013-12-15 DIAGNOSIS — Z87442 Personal history of urinary calculi: Secondary | ICD-10-CM | POA: Insufficient documentation

## 2013-12-15 DIAGNOSIS — F1110 Opioid abuse, uncomplicated: Secondary | ICD-10-CM | POA: Insufficient documentation

## 2013-12-15 DIAGNOSIS — Z8739 Personal history of other diseases of the musculoskeletal system and connective tissue: Secondary | ICD-10-CM | POA: Insufficient documentation

## 2013-12-15 DIAGNOSIS — F1510 Other stimulant abuse, uncomplicated: Secondary | ICD-10-CM | POA: Insufficient documentation

## 2013-12-15 DIAGNOSIS — G8929 Other chronic pain: Secondary | ICD-10-CM | POA: Insufficient documentation

## 2013-12-15 DIAGNOSIS — F1310 Sedative, hypnotic or anxiolytic abuse, uncomplicated: Secondary | ICD-10-CM | POA: Insufficient documentation

## 2013-12-15 LAB — CBC
HCT: 37.4 % — ABNORMAL LOW (ref 39.0–52.0)
HEMOGLOBIN: 12.3 g/dL — AB (ref 13.0–17.0)
MCH: 28.7 pg (ref 26.0–34.0)
MCHC: 32.9 g/dL (ref 30.0–36.0)
MCV: 87.2 fL (ref 78.0–100.0)
Platelets: 356 10*3/uL (ref 150–400)
RBC: 4.29 MIL/uL (ref 4.22–5.81)
RDW: 13.9 % (ref 11.5–15.5)
WBC: 6.7 10*3/uL (ref 4.0–10.5)

## 2013-12-15 LAB — COMPREHENSIVE METABOLIC PANEL
ALT: 18 U/L (ref 0–53)
AST: 19 U/L (ref 0–37)
Albumin: 4.3 g/dL (ref 3.5–5.2)
Alkaline Phosphatase: 80 U/L (ref 39–117)
BUN: 21 mg/dL (ref 6–23)
CALCIUM: 9.5 mg/dL (ref 8.4–10.5)
CHLORIDE: 101 meq/L (ref 96–112)
CO2: 25 meq/L (ref 19–32)
Creatinine, Ser: 1.07 mg/dL (ref 0.50–1.35)
GFR calc Af Amer: >90 mL/min (ref >90)
GFR, EST NON AFRICAN AMERICAN: 83 mL/min — AB (ref >90)
GLUCOSE: 93 mg/dL (ref 70–99)
Potassium: 4.2 mEq/L (ref 3.7–5.3)
SODIUM: 141 meq/L (ref 137–147)
Total Bilirubin: 0.2 mg/dL — ABNORMAL LOW (ref 0.3–1.2)
Total Protein: 7.8 g/dL (ref 6.0–8.3)

## 2013-12-15 LAB — RAPID URINE DRUG SCREEN, HOSP PERFORMED
AMPHETAMINES: POSITIVE — AB
Barbiturates: NOT DETECTED
Benzodiazepines: POSITIVE — AB
Cocaine: NOT DETECTED
Opiates: POSITIVE — AB
TETRAHYDROCANNABINOL: NOT DETECTED

## 2013-12-15 LAB — ETHANOL: Alcohol, Ethyl (B): <11 mg/dL (ref 0–11)

## 2013-12-15 LAB — ACETAMINOPHEN LEVEL: Acetaminophen (Tylenol), Serum: <15 ug/mL (ref 10–30)

## 2013-12-15 LAB — SALICYLATE LEVEL: Salicylate Lvl: <2 mg/dL — ABNORMAL LOW (ref 2.8–20.0)

## 2013-12-15 MED ORDER — HYDROXYZINE HCL 25 MG PO TABS
25.0000 mg | ORAL_TABLET | Freq: Three times a day (TID) | ORAL | Status: DC | PRN
  Administered 2013-12-15: 25 mg via ORAL
  Filled 2013-12-15: qty 1

## 2013-12-15 MED ORDER — TRAZODONE HCL 50 MG PO TABS
50.0000 mg | ORAL_TABLET | Freq: Every evening | ORAL | Status: DC | PRN
  Administered 2013-12-15: 50 mg via ORAL
  Filled 2013-12-15: qty 1

## 2013-12-15 MED ORDER — VENLAFAXINE HCL ER 75 MG PO CP24
75.0000 mg | ORAL_CAPSULE | Freq: Every day | ORAL | Status: DC
  Filled 2013-12-15: qty 1

## 2013-12-15 NOTE — ED Provider Notes (Signed)
Medical screening examination/treatment/procedure(s) were conducted as a shared visit with non-physician practitioner(s) or resident  and myself.  I personally evaluated the patient during the encounter and agree with the findings and plan unless otherwise indicated.    I have personally reviewed any xrays and/ or EKG's with the provider and I agree with interpretation.   Known bipolar, substance abuse.  Family filled out IVC paperwork for worsening aggression and non specific statements regarding SI.  Pt observed in ED.  Medically clear.  BH assessed, recommended outpatient fup.  Pt has no SI or HI currently.  5+ strength in UE and LE with f/e at major joints. Sensation to palpation intact in UE and LE. CNs 2-12 grossly intact.  EOMFI.  PERRL.   Finger nose and coordination intact bilateral.   Visual fields intact to finger testing. Abd soft/ NT, well appearing.  Neck supple. Discussion regarding outpatient follow up, patient comfortable with plan.  BH spoke with family on the phone, no specific attempt to injure them or himself, no plan.  I personally do not feel after examining patient and information that patient is a threat to self or others at this time. Commitment change performed by myself, I have revoked IVC paperwork done by his family.   Results and differential diagnosis were discussed with the patient. Close follow up outpatient was discussed, patient comfortable with the plan.   Filed Vitals:   12/15/13 1601 12/15/13 2204  BP: 138/87 119/74  Pulse: 101 84  Temp: 98.2 F (36.8 C) 97.1 F (36.2 C)  TempSrc: Oral Oral  Resp: 18 14  SpO2: 96% 99%     Aggression, Bipolar  Enid SkeensJoshua M Barbera Perritt, MD 12/15/13 2332

## 2013-12-15 NOTE — ED Notes (Signed)
IVC rescinded by Dr Loretha StaplerWofford.

## 2013-12-15 NOTE — ED Provider Notes (Signed)
CSN: [id number]     Arrival date & time 12/15/13  1513 History  This chart was scribed for non-physician practitioner working with Merrie Roof, MD by Ashley Jacobs, ED scribe. This patient was seen in room WTR4/WLPT4 and the patient's care was started at 4:56 PM.   First MD Initiated Contact with Patient 12/15/13 1645     Chief Complaint  Patient presents with  . Medical Clearance     (Consider location/radiation/quality/duration/timing/severity/associated sxs/prior Treatment) The history is provided by the patient and medical records. No language interpreter was used.   HPI Comments: Dale Contreras is a 43 y.o. male who presents to the Emergency Department for Medical Clearance. Pt is IVC and in custody of [place] [employer].   Per IVC, "respondent has been previously diagnosed with depression and bi-polar disorder. [name] has a history substance abuse and mental health issues. Family relates that respondent is abusing heroin and prescription pills currently and is becoming aggressive toward family and is making suicidal statements to family members. Family relates that they are concerned for their safety and that of the respondent as he continues to regress, he has been voluntarily committed previously and has been non-compliant with his prescribed medication regimen.".   Past Medical History  Diagnosis Date  . Renal disorder   . Colitis   . Kidney stones   . History of spinal fusion   . Chronic pain    Past Surgical History  Procedure Laterality Date  . Cervical fusion    . Shoulder arthroscopy with rotator cuff repair      Left  . Mandible surgery    . Ankle arthroscopy      Left   Family History  Problem Relation Age of Onset  . COPD Father    History  Substance Use Topics  . Smoking status: Never Smoker   . Smokeless tobacco: Not on file  . Alcohol Use: No    Review of Systems  Constitutional: Positive for activity change.   Psychiatric/Behavioral: Positive for behavioral problems and agitation. Negative for suicidal ideas and hallucinations.  All other systems reviewed and are negative.      Allergies  Review of patient's allergies indicates no known allergies.  Home Medications   Current Outpatient Rx  Name  Route  Sig  Dispense  Refill  . hydrOXYzine (ATARAX/VISTARIL) 25 MG tablet      Take 25 mg (1 tablet) three times daily as needed for anxiety/tension   60 tablet   0   . traZODone (DESYREL) 50 MG tablet   Oral   Take 1 tablet (50 mg total) by mouth at bedtime as needed for sleep.   30 tablet   0   . venlafaxine XR (EFFEXOR-XR) 75 MG 24 hr capsule   Oral   Take 1 capsule (75 mg total) by mouth daily with breakfast. For depression   30 capsule   0    BP 138/87  Pulse 101  Temp(Src) 98.2 F (36.8 C) (Oral)  Resp 18  SpO2 96% Physical Exam  Nursing note and vitals reviewed. Constitutional: He appears well-developed and well-nourished. No distress.  HENT:  Head: Normocephalic and atraumatic.  Eyes: Conjunctivae and EOM are normal.  Neck: Normal range of motion.  Cardiovascular: Normal rate and regular rhythm.  Exam reveals no gallop and no friction rub.   No murmur heard. Pulmonary/Chest: Effort normal and breath sounds normal. He has no wheezes. He has no rales. He exhibits no tenderness.  Abdominal: Soft.  There is no tenderness.  Musculoskeletal: Normal range of motion.  Neurological: He is alert.  Speech is goal-oriented. Moves limbs without ataxia.   Skin: Skin is warm and dry.    ED Course  Procedures (including critical care time) Labs Review Labs Reviewed  CBC - Abnormal; Notable for the following:    Hemoglobin 12.3 (*)    HCT 37.4 (*)    All other components within normal limits  COMPREHENSIVE METABOLIC PANEL - Abnormal; Notable for the following:    Total Bilirubin 0.2 (*)    GFR calc non Af Amer 83 (*)    All other components within normal limits   SALICYLATE LEVEL - Abnormal; Notable for the following:    Salicylate Lvl <2.0 (*)    All other components within normal limits  URINE RAPID DRUG SCREEN (HOSP PERFORMED) - Abnormal; Notable for the following:    Opiates POSITIVE (*)    Benzodiazepines POSITIVE (*)    Amphetamines POSITIVE (*)    All other components within normal limits  ACETAMINOPHEN LEVEL  ETHANOL   Imaging Review No results found.   EKG Interpretation None      MDM   Final diagnoses:  None    6:09 PM Labs unremarkable for acute changes. Patient's urine positive for opiates, benzos, and amphetamines. Vitals stable and patient afebrile. Patient will be moved to the psych ED.     Emilia BeckKaitlyn Janiqua Friscia, PA-C 12/15/13 1810

## 2013-12-15 NOTE — BH Assessment (Signed)
[facility] Assessment Progress Note  At 19:29 I spoke to [facility] Szekalski, PA in anticipation of TTS assessment.  Doylene Canninghomas Karliah Kowalchuk, MA Triage Specialist 12/15/2013 @ 19:32

## 2013-12-15 NOTE — Discharge Instructions (Signed)
If you were given medicines take as directed.  If you are on coumadin or contraceptives realize their levels and effectiveness is altered by many different medicines.  If you have any reaction (rash, tongues swelling, other) to the medicines stop taking and see a physician.   Please follow up as directed and return to the ER or see a physician for new or worsening symptoms.  Thank you. Filed Vitals:   12/15/13 1601 12/15/13 2204  BP: 138/87 119/74  Pulse: 101 84  Temp: 98.2 F (36.8 C) 97.1 F (36.2 C)  TempSrc: Oral Oral  Resp: 18 14  SpO2: 96% 99%    Emergency Department Resource Guide 1) Find a Doctor and Pay Out of Pocket Although you won't have to find out who is covered by your insurance plan, it is a good idea to ask around and get recommendations. You will then need to call the office and see if the doctor you have chosen will accept you as a new patient and what types of options they offer for patients who are self-pay. Some doctors offer discounts or will set up payment plans for their patients who do not have insurance, but you will need to ask so you aren't surprised when you get to your appointment.  2) Contact Your Local Health Department Not all health departments have doctors that can see patients for sick visits, but many do, so it is worth a call to see if yours does. If you don't know where your local health department is, you can check in your phone book. The CDC also has a tool to help you locate your state's health department, and many state websites also have listings of all of their local health departments.  3) Find a Walk-in Clinic If your illness is not likely to be very severe or complicated, you may want to try a walk in clinic. These are popping up all over the country in pharmacies, drugstores, and shopping centers. They're usually staffed by nurse practitioners or physician assistants that have been trained to treat common illnesses and complaints. They're  usually fairly quick and inexpensive. However, if you have serious medical issues or chronic medical problems, these are probably not your best option.  No Primary Care Doctor: - Call Health Connect at  [phone] - they can help you locate a primary care doctor that  accepts your insurance, provides certain services, etc. - Physician Referral Service- [phone]  Chronic Pain Problems: Organization         Address  Phone   Notes  [facility]  [phone] Patients need to be referred by their primary care doctor.   Medication Assistance: Organization         Address  Phone   Notes  [facility] [address] [place], [place] [address] [phone] --Must be a resident of [place] [facility] [place] -- Must have NO insurance coverage whatsoever (no Medicaid/ Medicare, etc.) -- The pt. MUST have a primary care doctor that directs their care regularly and follows them in the community   MedAssist  [phone]   [employer]  [phone]    Agencies that provide inexpensive medical care: Organization         Address  Phone   Notes  [facility]  [phone]   [facility]    [phone]   [facility] [address] [place], [place] [address] [phone]   [facility] [address] [place] [phone]   Planned Parenthood    [phone]   [facility]    [phone]   [facility]  [address], [place] Phone:  [phone], Fax:  [phone] Hours of Operation:  9 am - 6 pm, M-F.  Also accepts Medicaid/Medicare and self-pay.  [facility]  [address], [place] Phone: [phone], Fax: [phone]. Hours of Operation:  8:30 am - 5:30 pm, M-F.  Also accepts Medicaid and self-pay.  [facility] [place] [address], [place] Phone:  [phone]   [facility] [address] Natasha Bence [place], [place] [phone], Ext. [phone] Mondays & Thursdays: 7-9 AM.  First 15 patients are seen on a first come, first serve basis.    Medicaid-accepting [facility] Providers:  Organization         Address  Phone   Notes  [facility] [address], [place] [phone] Also accepts self-pay patients.  [facility] [address] Laurell Josephs [place], [place]  [phone]   [facility] [address], [place] [phone]   [facility] [address] [place] [phone]   Renaye Rakers [address], [place]   [phone] Only accepts [place] Access [place] patients after they have their name applied to their card.   Self-Pay (no insurance) in [facility]:  Organization         Address  Phone   Notes  Sickle Cell Patients, [facility] [address] [place], [place] [phone]   [facility] [address] [place], [place] [phone]   [facility] [place]  [address], [place] [phone]   [facility]/Dr. Osei-Bonsu  [address], [place] or [address], [place] [phone] Phone number for both [place] and [place] locations is the same.  [facility] [address] [place] [phone]   [facility] [address], [place] or [address] [phone]   [facility] [address] [place] [phone], phone; [phone], fax Sees patients 1st and 3rd Saturday of every month.  Must not qualify for public or private insurance (i.e. Medicaid, Medicare, [place] Health Choice, Veterans' Benefits)  Household income should be no more than 200% of the poverty level The clinic cannot treat  you if you are pregnant or think you are pregnant  Sexually transmitted diseases are not treated at the clinic.    Dental Care: Organization         Address  Phone  Notes  [facility] [address] [place], [place] [phone] Accepts children up to age 64 who are enrolled in [place] or [place] Health Choice; pregnant women with a Medicaid card; and children who have applied for Medicaid or [place] Health Choice, but were declined, whose parents can pay a reduced fee at time of service.  [facility]  [address], [place] [phone] Accepts children up to age 68 who are enrolled in [place] or [place] Health Choice; pregnant women with a  Medicaid card; and children who have applied for Medicaid or [place] Health Choice, but were declined, whose parents can pay a reduced fee at time of service.  [facility]  [address] [place], [place] [phone] Patients are seen by appointment only. Walk-ins are not accepted. [facility] will see patients 81 years of age and older. Monday - Tuesday (8am-5pm) Most Wednesdays (8:30-5pm) $30 per visit, cash only  [facility] Access PRO[facility]  [address], [facility] [phone] Patients are seen by appointment only. Walk-ins are not accepted. [facility] will see patients 36 years of age and older. One Wednesday Evening (Monthly: Volunteer Based).  $30 per visit, cash only  [employer]  [phone] for adults; Children under age 38, call Graduate Pediatric Dentistry at [phone]. Children aged 38-14, please call [phone] to request a pediatric application.  Dental services are provided in all areas of dental care including fillings, crowns and bridges, complete and partial dentures, implants, gum treatment, root canals, and extractions. Preventive care is also provided. Treatment  is provided to both adults and children. Patients are selected via a lottery and there is often a waiting list.   [facility] [address] [place]  [phone] [email or web address]   [facility] [address] [place], [place] [phone], Ext. [phone] Second and Fourth Thursday of each month, opens at 6:30 AM; Clinic ends at 9 AM.  Patients are seen on a first-come first-served basis, and a limited number are seen during each clinic.   [facility]  [address] Ether Griffins [place], [place] [phone]   Eligibility Requirements You must have lived in [place], [place], or [place] counties for at least the last three months.   You cannot be eligible for state or federal sponsored [place], including [employer], [place], or [employer].   You generally cannot be eligible for healthcare insurance through your employer.    How to apply: Eligibility screenings are held every Tuesday and Wednesday afternoon from 1:00 pm until 4:00 pm. You do not need an appointment for the interview!  [facility] [address], [place], [place] [phone]   [facility] Health Department  [phone]   [facility] Health Department  [phone]   [facility] Health Department  [phone]    Behavioral Health Resources in the Community: Intensive Outpatient Programs Organization         Address  Phone  Notes  [facility] Services [address] [place], [place] [phone]   [facility] [address], [place], [place] [phone]   [facility]: Alcohol [facility] Drug Svcs [address], [place], [place]  [phone]   [facility] Mental Health [address],  [place], [place] [phone] or [phone]   Substance Abuse Resources Organization         Address  Phone  Notes  Alcohol and Drug Services  [phone]   [facility]  [phone]   The  [place]  [phone]   Floydene Flock  [phone]   Residential & Outpatient Substance Abuse Program  [phone]   Psychological Services Organization         Address  Phone  Notes  [facility] Behavioral Health  [phone]   [facility] Services  [phone]   [facility] Mental Health [address], [place] [phone] or [phone]    Mobile Crisis Teams Organization  Address  Phone  Notes  [facility], Mobile Crisis Care Unit  [phone]   [facility]  [address] [place], [place]   [facility] [address] [place] [phone]    Self-Help/Support Groups Organization         Address  Phone             Notes  [place] [facility] [place] - variety of support groups  [place] Call for more information  Narcotics Anonymous (NA), [facility] [address], [employer] [place]  2 meetings at this location   Special educational needs teacher         Address  Phone  Notes  [facility] [place],    [place]  [phone]   [facility]  [address] [place] [phone], [place], [place]   [place], [place] [phone] Admissions: 8am-3pm M-F  Incentives Substance [place] [address]    [address], [place] [phone]   [facility] [address] [place], [place], [place]   [facility] [address],  [place], [place]   [facility] Programs - Intensive Outpatient [address] Kristeen Mans [address], [place], [place]   [facility].,  [place], [place] [phone] or [phone]   [facility] (RTS) [address], [place], [place] Accepts Medicaid  [facility] [place] [address]  [place] [phone] Substance Abuse/Addiction Treatment     [facility] Organization         Address  Phone  Notes  [facility]  [phone]   Domenic Schwab, PhD [address] Arlis Porta [place], [place]   [phone] or [phone]   [place], [place] [phone]   [facility] [address] [place], [place] [phone] Insurance/Medicaid/sponsorship through [facility] [place] and Families [address], [address]                                    [place], [place] [phone] [place] [address], [place] [phone]    Dr. Adele Schilder  [phone]   [facility] Dept. 1) [address], [place] 2) [place] 3)  [place] [address], [place] [phone]  [phone]   [place] [phone] or [phone] (After Hours)

## 2013-12-15 NOTE — ED Notes (Signed)
Pt under IVC presents for medical clearance.  Pt IVCed by brother.  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  Pt requesting [facility] for treatment.  Pt complaining of chronic pain, neck pain, lower back pain, rates pain as 9 on pain scale.  Denies SI, HI or AV hallucinations, feeling hopeless.  Pt cooperative but irritable, anxious.

## 2013-12-15 NOTE — BH Assessment (Signed)
Assessment Note  Dale Contreras is a 43 y.o. divorced white male.  He presents at [facility] by [employer] under IVC initiated by his brother, [name].  He is unaccompanied at the time of the assessment.  After speaking to the pt at 19:50 I made several attempts to reach his mother [phone]) and his brother [phone]), finally reaching the mother at 21:25 and the brother at 21:33.  The petition reports that the pt has recently been aggressive and that he made suicidal statements, as well as using drugs.  Stressors: Pt reports that on or about Tuesday 12/06/2013 he was released from [facility], with a plan to follow up with residential treatment at [facility] about 5 days later.  Because his brother was about to be released from [facility], to return to the home of their mother where the pt has been living, the pt decided that it would be best for them not to be in the same household at the same time.  He therefore has been living with an aunt in [place].  Today the pt intended to return to [facility] in order to obtain identification and several other items that Dale Contreras would require him to have.  He called his mother to coordinate the visit, but had some trouble making these arrangements and became frustrated and angry.  Pt reports that upon arrival at the home, he found that it was locked and had to wait.  The brother reports that the pt actually entered the home, and the mother reports that she found what appeared to be residue from crushed pills in the pt's room.  In any case, shortly thereafter law enforcement arrived to serve the petition initiated by the brother.  Lethality: Suicidality: Pt denies SI currently or at any time in the past.  He identifies his 72 y/o daughter as a deterrent against suicide.  He denies any history of suicide attempts or of self mutilation.  The mother has only second hand reports from the brother.  Initially the brother reports that the pt stated he was going to kill  himself, but with further questioning he reveals that at 05:30 this morning the pt said he wished he were dead while speaking to the brother by telephone.  The brother denies that the pt ever threatened to actually take his life.  He further denies hearing the pt voice a suicide plan.  The mother reports that 4 or 5 months ago, while intoxicated, the pt took a knife and superficially cut his forearm.  This is as close as the pt has come to making a suicide attempt, according to the mother.  Pt denies depression or any related symptoms with the exception of some feelings of guilt with regard to the impact of his substance abuse on his daughter. Homicidality: Pt denies HI, any history of attempts to kill another person, or any recent physical aggression toward others.  He reports that in the past he and his brother engaged in mutual aggression while intoxicated.  The mother denies that the pt has ever made homicidal threats, brandished weapons, or assaulted others.  She also denies ever fearing for her safety around him.  The brother initially reports aggression by the pt, but with questioning reports that that was prior to his admission to [facility] on 12/02/2013.  The two of them have not been in proximity to each other since then.  Pt denies having access to firearms.  He reports a court date that was recently postponed for larceny and  possession of stolen property.  During assessment he is restless and somewhat irritated about the IVC, but is calm and cooperative. Psychosis: Pt denies hallucinations.  Pt does not appear to be responding to internal stimuli and exhibits no delusional thought.  Pt's reality testing appears to be intact.  The mother reports when she spoke to the pt this morning his speech was irrational, making her believe that he was intoxicated.  Currently his thought processes are rational, linear and goal directed. Substance Abuse: Pt reports that prior to his [facility], his substance of choice  was powder cocaine.  However, the EPIC record shows that he was detoxified from heroin.  He reports that he has been sober since he was [facility].  His restlessness may be consistent either with current intoxication or withdrawal.  Please see UDS results.  Social History: Pt identifies his ex-wife, a Engineer, civil (consulting)nurse who works for the [employer], as his main support.  Until he was placed under IVC today, he would have counted his mother, Dale Contreras, with whom he lived, as his main support.  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  He is currently not gainfully employed.  Treatment History: Pt reports that in 2014 he was admitted to the [facility] for detoxification and rehabilitation.  As noted, he was [facility] on 12/02/2013, and was to follow [facility].  He was also advised to [facility] to [facility] for ongoing outpatient psychiatry needs.  However, he denies ever receiving outpatient treatment of any kind, other than attending 12-Step meeting.  Today pt still intends to follow up with [facility], but he wants to be released from involuntary commitment and to pursue treatment when he is ready.  Axis I: Polysubstance Dependence 304.80; Mood Disorder NOS 296.90 Axis II: Deferred 799.9 Axis III:  Past Medical History  Diagnosis Date  . Renal disorder   . Colitis   . Kidney stones   . History of spinal fusion   . Chronic pain    Axis IV: housing problems, problems related to legal system/crime, problems with access to health care services, problems with primary support group and general medical problems Axis V: GAF = 50  Past Medical History:  Past Medical History  Diagnosis Date  . Renal disorder   . Colitis   . Kidney stones   . History of spinal fusion   . Chronic pain     Past Surgical History  Procedure Laterality Date  . Cervical fusion    . Shoulder  arthroscopy with rotator cuff repair      Left  . Mandible surgery    . Ankle arthroscopy      Left    Family History:  Family History  Problem Relation Age of Onset  . COPD Father     Social History:  reports that he has never smoked. He has never used smokeless tobacco. He reports that he does not drink alcohol or use illicit drugs.  Additional Social History:  Alcohol / Drug Use Pain Medications: Reports recent us of Fiorcet by prescription Prescriptions: Denies abusing any prescriptions Over the Counter: Denies Longest period of sobriety (when/how long): 10 years (2000 - 2010); reports that he has been sober since [facility] Negative Consequences of Use: Personal relationships;Legal Substance #1 Name of Substance 1: Pt reports that powder cocaine was his  substance of choice prior to admission to [facility]; EPIC shows pt was using heroin.  Pt denies any use since discharge.  CIWA: CIWA-Ar BP: 119/74 mmHg Pulse Rate: 84 COWS:    Allergies: No Known Allergies  Home Medications:  (Not in a [facility] admission)  OB/GYN Status:  No LMP for male patient.  General Assessment Data [facility] of [facility]: [facility] Is this a Tele or Face-to-Face Assessment?: Face-to-Face Is this an Initial Assessment or a Re-assessment for this encounter?: Initial Assessment Living Arrangements: Parent (Mother & brother) Can pt return to current living arrangement?: Yes (However, pt does not want to return.) Admission Status: Involuntary Is patient capable of signing voluntary admission?: No Transfer from: Acute [facility] Referral Source: Other [place])     [facility] Crisis Care Plan Living Arrangements: Parent (Mother & brother) Name of Psychiatrist: None Name of Therapist: None  Education Status Is patient currently in school?: Yes Current Grade: Master's level Highest grade of school patient has completed: Bachelor's in Theology Name of school: [employer]  [employer] person: Earl Lites (ex-wife) [phone]  Risk to self Suicidal Ideation: No (Passive only: per mom, pt said he wished he were dead today.) Suicidal Intent: No Is patient at risk for suicide?: No Suicidal Plan?: No Access to Means: No What has been your use of drugs/alcohol within the last 12 months?: Recent use of powder cocaine & heroin Previous Attempts/Gestures: No How many times?: 0 Other Self Harm Risks: Chronic pain; pt identifies 28 y/o daughter as deterrent. Triggers for Past Attempts: Other (Comment) (Not applicable) Intentional Self Injurious Behavior: Cutting Comment - Self Injurious Behavior: Pt denies; mother reports seeing pt intentionally cut arm while intoxicated 4 - 5 months ago. Family Suicide History: No (Parents: depression) Recent stressful life event(s): Conflict (Comment) (Conflict w/ family over today's IVC.) Persecutory voices/beliefs?: No Depression: No Depression Symptoms: Guilt Substance abuse history and/or treatment for substance abuse?: Yes (Recent use of powder cocaine & heroin) Suicide prevention information given to non-admitted patients: Yes  Risk to Others Homicidal Ideation: No (Petition asserts aggression; pt & mother deny) Thoughts of Harm to Others: No Current Homicidal Intent: No Current Homicidal Plan: No Access to Homicidal Means: No Identified Victim: None History of harm to others?: No Assessment of Violence: None Noted Violent Behavior Description: Restless but cooperative Does patient have access to weapons?: No (Denies having firearms) Criminal Charges Pending?: Yes Describe Pending Criminal Charges: Larceny, possession of stolen property Does patient have a court date: Yes Court Date:  (Uncertain; extended from 12/09/2013)  Psychosis Hallucinations: None noted Delusions: None noted  Mental Status Report Appear/Hygiene: Other (Comment) (Paper scrubs) Eye Contact: Fair Motor Activity: Restlessness  (Leg motions) Speech: Other (Comment) (Unremarkable) Level of Consciousness: Alert Mood: Anxious;Other (Comment) (Pleasant, frustrated) Affect: Appropriate to circumstance Anxiety Level: Minimal Thought Processes: Coherent;Relevant (Mother reports pt was irrational on phone earlier today.) Judgement: Unimpaired Orientation: Person;Time;Place;Situation Obsessive Compulsive Thoughts/Behaviors: Minimal (Self identifies as a "neat freak.")  Cognitive Functioning Concentration: Normal Memory: Recent Intact;Remote Intact IQ: Average Insight: Fair Impulse Control: Good Appetite: Good Weight Loss: 0 Weight Gain: 0 Sleep: No Change Total Hours of Sleep: 9 Vegetative Symptoms: None  ADLScreening [facility] Assessment Services) Patient's cognitive ability adequate to safely complete daily activities?: Yes Patient able to express need for assistance with ADLs?: Yes Independently performs ADLs?: Yes (appropriate for developmental age)  Prior Inpatient Therapy Prior Inpatient Therapy: Yes Prior Therapy Dates: 12/02/2013: [facility] for detox Prior Therapy Facilty/Provider(s): 01/2013: [facility] for detox/rehab Reason for Treatment: Was to  be admitted to [facility] for rehab after D/C from [facility].  Prior Outpatient Therapy Prior Outpatient Therapy: No Prior Therapy Dates: 2000: 12-Step meetings (has also gone subsequently)  ADL Screening (condition at time of admission) Patient's cognitive ability adequate to safely complete daily activities?: Yes Is the patient deaf or have difficulty hearing?: No Does the patient have difficulty seeing, even when wearing glasses/contacts?: No Does the patient have difficulty concentrating, remembering, or making decisions?: No Patient able to express need for assistance with ADLs?: Yes Does the patient have difficulty dressing or bathing?: No Independently performs ADLs?: Yes (appropriate for developmental age) Does the patient have difficulty walking or climbing stairs?:  No Weakness of Legs: None Weakness of Arms/Hands: None  Home Assistive Devices/Equipment Home Assistive Devices/Equipment: None    Abuse/Neglect Assessment (Assessment to be complete while patient is alone) Physical Abuse: Denies Verbal Abuse: Denies Sexual Abuse: Denies Exploitation of patient/patient's resources: Denies Self-Neglect: Denies Values / Beliefs Cultural Requests During Hospitalization: Other (comment) (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, multimedia theology student.) Spiritual Requests During Hospitalization: None   Advance Directives (For Healthcare) Advance Directive: Patient does not have advance directive;Patient would not like information Pre-existing out of facility DNR order (yellow form or pink MOST form): No Nutrition Screen- [facility] Patient's home diet: Regular  Additional Information 1:1 In Past 12 Months?: No CIRT Risk: No Elopement Risk: No Does patient have medical clearance?: Yes     Disposition:  Disposition Initial Assessment Completed for this Encounter: Yes Disposition of Patient: Other dispositions Other disposition(s): Other (Comment) (Rescind petition and release pt; follow up w/ outpt provider) After consulting with Alberteen Sam, NP it has been determined that before a conclusive decision can be made about pt's needs, the family would have to be reached.  After speaking to both the mother and the brother of the pt, I spoke with EDP Blane Ohara, MD, who concludes that pt does not present an immanent danger to himself or others, and therefore does not meet criteria for involuntary commitment.  He will rescind the petition.  Pt was provided with written referrals to [facility] for outpatient psychiatry and counseling; to [facility] for residential substance abuse treatment; and to [facility] for outpatient substance abuse treatment.  On Site Evaluation by:   Reviewed with Physician:  Alberteen Sam, NP @ 20:45  Doylene Canning,  MA Triage Specialist Raphael Gibney 12/15/2013 10:15 PM

## 2013-12-15 NOTE — ED Notes (Signed)
Pt presents in custody of [place] [employer] and is under IVC. IVC paperwork states: "respondent has been previously diagnosed with depression and bi-polar disorder. [name] has a history substance abuse and mental health issues. Family relates that respondent is abusing heroin and prescription pills currently and is becoming aggressive toward family and is making suicidal statements to family members. Family relates that they are concerned for their safety and that of the respondent as he continues to regress, he has been voluntarily committed previously and has been non-compliant with his prescribed medication regimen."  Pt reports being in pain due to numerous surgeries and spinal fusion, which he states he can not deal with the pain and he is tired of living in pain. Pt denies using any recreational drugs or alcohol. Pt denies SI/HI at this time. Pt denies auditory or visual hallucinations.

## 2013-12-15 NOTE — ED Notes (Signed)
Pt assessed by TTS Tom at present.

## 2013-12-28 DEATH — deceased
